# Patient Record
Sex: Male | Born: 1980
Health system: Southern US, Community
[De-identification: ages and names within clinical notes are randomized; demographics above are authoritative.]

## PROBLEM LIST (undated history)

## (undated) DIAGNOSIS — I2510 Atherosclerotic heart disease of native coronary artery without angina pectoris: Secondary | ICD-10-CM

## (undated) DIAGNOSIS — E785 Hyperlipidemia, unspecified: Secondary | ICD-10-CM

## (undated) HISTORY — DX: Hyperlipidemia, unspecified: E78.5

## (undated) HISTORY — DX: Atherosclerotic heart disease of native coronary artery without angina pectoris: I25.10

## (undated) HISTORY — PX: WISDOM TOOTH EXTRACTION: SHX21

## (undated) HISTORY — PX: OTHER SURGICAL HISTORY: SHX169

---

## 2015-06-17 DIAGNOSIS — H5213 Myopia, bilateral: Secondary | ICD-10-CM | POA: Diagnosis not present

## 2015-06-17 DIAGNOSIS — H52221 Regular astigmatism, right eye: Secondary | ICD-10-CM | POA: Diagnosis not present

## 2015-08-10 DIAGNOSIS — D225 Melanocytic nevi of trunk: Secondary | ICD-10-CM | POA: Diagnosis not present

## 2015-08-10 DIAGNOSIS — D2261 Melanocytic nevi of right upper limb, including shoulder: Secondary | ICD-10-CM | POA: Diagnosis not present

## 2015-08-10 DIAGNOSIS — D485 Neoplasm of uncertain behavior of skin: Secondary | ICD-10-CM | POA: Diagnosis not present

## 2015-08-10 DIAGNOSIS — D1801 Hemangioma of skin and subcutaneous tissue: Secondary | ICD-10-CM | POA: Diagnosis not present

## 2015-08-10 DIAGNOSIS — D2262 Melanocytic nevi of left upper limb, including shoulder: Secondary | ICD-10-CM | POA: Diagnosis not present

## 2016-04-10 ENCOUNTER — Institutional Professional Consult (permissible substitution): Payer: Self-pay | Admitting: Pulmonary Disease

## 2016-04-11 ENCOUNTER — Institutional Professional Consult (permissible substitution): Payer: Self-pay | Admitting: Pulmonary Disease

## 2016-04-19 DIAGNOSIS — G478 Other sleep disorders: Secondary | ICD-10-CM | POA: Diagnosis not present

## 2016-04-19 DIAGNOSIS — J3489 Other specified disorders of nose and nasal sinuses: Secondary | ICD-10-CM | POA: Diagnosis not present

## 2016-04-19 DIAGNOSIS — R0683 Snoring: Secondary | ICD-10-CM | POA: Diagnosis not present

## 2016-04-19 DIAGNOSIS — R0681 Apnea, not elsewhere classified: Secondary | ICD-10-CM | POA: Diagnosis not present

## 2016-04-21 DIAGNOSIS — G4733 Obstructive sleep apnea (adult) (pediatric): Secondary | ICD-10-CM | POA: Diagnosis not present

## 2016-04-23 DIAGNOSIS — G4733 Obstructive sleep apnea (adult) (pediatric): Secondary | ICD-10-CM | POA: Diagnosis not present

## 2016-05-02 DIAGNOSIS — G4733 Obstructive sleep apnea (adult) (pediatric): Secondary | ICD-10-CM | POA: Diagnosis not present

## 2016-05-22 DIAGNOSIS — G4733 Obstructive sleep apnea (adult) (pediatric): Secondary | ICD-10-CM | POA: Diagnosis not present

## 2016-06-02 DIAGNOSIS — G4733 Obstructive sleep apnea (adult) (pediatric): Secondary | ICD-10-CM | POA: Diagnosis not present

## 2016-06-19 DIAGNOSIS — H5213 Myopia, bilateral: Secondary | ICD-10-CM | POA: Diagnosis not present

## 2016-06-19 DIAGNOSIS — H52221 Regular astigmatism, right eye: Secondary | ICD-10-CM | POA: Diagnosis not present

## 2016-07-02 DIAGNOSIS — G4733 Obstructive sleep apnea (adult) (pediatric): Secondary | ICD-10-CM | POA: Diagnosis not present

## 2016-08-01 DIAGNOSIS — J310 Chronic rhinitis: Secondary | ICD-10-CM | POA: Diagnosis not present

## 2016-08-01 DIAGNOSIS — J343 Hypertrophy of nasal turbinates: Secondary | ICD-10-CM | POA: Diagnosis not present

## 2016-08-01 DIAGNOSIS — J342 Deviated nasal septum: Secondary | ICD-10-CM | POA: Diagnosis not present

## 2016-08-02 DIAGNOSIS — G4733 Obstructive sleep apnea (adult) (pediatric): Secondary | ICD-10-CM | POA: Diagnosis not present

## 2016-08-06 DIAGNOSIS — G4733 Obstructive sleep apnea (adult) (pediatric): Secondary | ICD-10-CM | POA: Diagnosis not present

## 2016-08-22 DIAGNOSIS — G4733 Obstructive sleep apnea (adult) (pediatric): Secondary | ICD-10-CM | POA: Diagnosis not present

## 2016-09-01 DIAGNOSIS — G4733 Obstructive sleep apnea (adult) (pediatric): Secondary | ICD-10-CM | POA: Diagnosis not present

## 2016-10-02 DIAGNOSIS — G4733 Obstructive sleep apnea (adult) (pediatric): Secondary | ICD-10-CM | POA: Diagnosis not present

## 2016-10-03 DIAGNOSIS — M545 Low back pain: Secondary | ICD-10-CM | POA: Diagnosis not present

## 2016-10-03 DIAGNOSIS — G4733 Obstructive sleep apnea (adult) (pediatric): Secondary | ICD-10-CM | POA: Diagnosis not present

## 2016-10-03 DIAGNOSIS — J342 Deviated nasal septum: Secondary | ICD-10-CM | POA: Diagnosis not present

## 2016-10-03 DIAGNOSIS — J328 Other chronic sinusitis: Secondary | ICD-10-CM | POA: Diagnosis not present

## 2016-10-03 DIAGNOSIS — Z1389 Encounter for screening for other disorder: Secondary | ICD-10-CM | POA: Diagnosis not present

## 2016-11-07 DIAGNOSIS — G4733 Obstructive sleep apnea (adult) (pediatric): Secondary | ICD-10-CM | POA: Diagnosis not present

## 2016-11-29 DIAGNOSIS — G4733 Obstructive sleep apnea (adult) (pediatric): Secondary | ICD-10-CM | POA: Diagnosis not present

## 2016-12-01 DIAGNOSIS — Z23 Encounter for immunization: Secondary | ICD-10-CM | POA: Diagnosis not present

## 2017-02-27 DIAGNOSIS — Z0000 Encounter for general adult medical examination without abnormal findings: Secondary | ICD-10-CM | POA: Diagnosis not present

## 2017-03-06 DIAGNOSIS — Z0000 Encounter for general adult medical examination without abnormal findings: Secondary | ICD-10-CM | POA: Diagnosis not present

## 2017-03-06 DIAGNOSIS — Z1389 Encounter for screening for other disorder: Secondary | ICD-10-CM | POA: Diagnosis not present

## 2017-03-06 DIAGNOSIS — J328 Other chronic sinusitis: Secondary | ICD-10-CM | POA: Diagnosis not present

## 2017-03-06 DIAGNOSIS — G4733 Obstructive sleep apnea (adult) (pediatric): Secondary | ICD-10-CM | POA: Diagnosis not present

## 2017-03-26 DIAGNOSIS — G4733 Obstructive sleep apnea (adult) (pediatric): Secondary | ICD-10-CM | POA: Diagnosis not present

## 2017-05-23 DIAGNOSIS — F4322 Adjustment disorder with anxiety: Secondary | ICD-10-CM | POA: Diagnosis not present

## 2017-05-29 DIAGNOSIS — F4322 Adjustment disorder with anxiety: Secondary | ICD-10-CM | POA: Diagnosis not present

## 2017-05-31 DIAGNOSIS — G4733 Obstructive sleep apnea (adult) (pediatric): Secondary | ICD-10-CM | POA: Diagnosis not present

## 2017-06-05 DIAGNOSIS — F4322 Adjustment disorder with anxiety: Secondary | ICD-10-CM | POA: Diagnosis not present

## 2017-06-14 DIAGNOSIS — F4322 Adjustment disorder with anxiety: Secondary | ICD-10-CM | POA: Diagnosis not present

## 2017-06-26 DIAGNOSIS — F4322 Adjustment disorder with anxiety: Secondary | ICD-10-CM | POA: Diagnosis not present

## 2017-07-23 DIAGNOSIS — H5213 Myopia, bilateral: Secondary | ICD-10-CM | POA: Diagnosis not present

## 2017-07-23 DIAGNOSIS — H52223 Regular astigmatism, bilateral: Secondary | ICD-10-CM | POA: Diagnosis not present

## 2017-07-24 DIAGNOSIS — F4322 Adjustment disorder with anxiety: Secondary | ICD-10-CM | POA: Diagnosis not present

## 2017-08-05 DIAGNOSIS — G4733 Obstructive sleep apnea (adult) (pediatric): Secondary | ICD-10-CM | POA: Diagnosis not present

## 2017-08-22 DIAGNOSIS — F4322 Adjustment disorder with anxiety: Secondary | ICD-10-CM | POA: Diagnosis not present

## 2017-09-17 DIAGNOSIS — G4733 Obstructive sleep apnea (adult) (pediatric): Secondary | ICD-10-CM | POA: Diagnosis not present

## 2017-10-22 DIAGNOSIS — F4322 Adjustment disorder with anxiety: Secondary | ICD-10-CM | POA: Diagnosis not present

## 2017-11-02 DIAGNOSIS — Z23 Encounter for immunization: Secondary | ICD-10-CM | POA: Diagnosis not present

## 2017-11-05 DIAGNOSIS — F4322 Adjustment disorder with anxiety: Secondary | ICD-10-CM | POA: Diagnosis not present

## 2017-11-26 DIAGNOSIS — F4322 Adjustment disorder with anxiety: Secondary | ICD-10-CM | POA: Diagnosis not present

## 2017-12-25 DIAGNOSIS — G4733 Obstructive sleep apnea (adult) (pediatric): Secondary | ICD-10-CM | POA: Diagnosis not present

## 2018-03-04 DIAGNOSIS — M545 Low back pain: Secondary | ICD-10-CM | POA: Diagnosis not present

## 2018-03-04 DIAGNOSIS — Z0000 Encounter for general adult medical examination without abnormal findings: Secondary | ICD-10-CM | POA: Diagnosis not present

## 2018-03-11 DIAGNOSIS — Z6825 Body mass index (BMI) 25.0-25.9, adult: Secondary | ICD-10-CM | POA: Diagnosis not present

## 2018-03-11 DIAGNOSIS — Z0000 Encounter for general adult medical examination without abnormal findings: Secondary | ICD-10-CM | POA: Diagnosis not present

## 2018-03-11 DIAGNOSIS — Z1331 Encounter for screening for depression: Secondary | ICD-10-CM | POA: Diagnosis not present

## 2018-04-11 DIAGNOSIS — G4733 Obstructive sleep apnea (adult) (pediatric): Secondary | ICD-10-CM | POA: Diagnosis not present

## 2018-07-03 DIAGNOSIS — D2261 Melanocytic nevi of right upper limb, including shoulder: Secondary | ICD-10-CM | POA: Diagnosis not present

## 2018-07-03 DIAGNOSIS — D2272 Melanocytic nevi of left lower limb, including hip: Secondary | ICD-10-CM | POA: Diagnosis not present

## 2018-07-03 DIAGNOSIS — D485 Neoplasm of uncertain behavior of skin: Secondary | ICD-10-CM | POA: Diagnosis not present

## 2018-07-03 DIAGNOSIS — D2262 Melanocytic nevi of left upper limb, including shoulder: Secondary | ICD-10-CM | POA: Diagnosis not present

## 2018-07-03 DIAGNOSIS — D225 Melanocytic nevi of trunk: Secondary | ICD-10-CM | POA: Diagnosis not present

## 2018-07-10 DIAGNOSIS — G4733 Obstructive sleep apnea (adult) (pediatric): Secondary | ICD-10-CM | POA: Diagnosis not present

## 2018-07-22 DIAGNOSIS — D2261 Melanocytic nevi of right upper limb, including shoulder: Secondary | ICD-10-CM | POA: Diagnosis not present

## 2018-07-22 DIAGNOSIS — D485 Neoplasm of uncertain behavior of skin: Secondary | ICD-10-CM | POA: Diagnosis not present

## 2018-07-24 DIAGNOSIS — L7211 Pilar cyst: Secondary | ICD-10-CM | POA: Diagnosis not present

## 2018-08-05 DIAGNOSIS — H5213 Myopia, bilateral: Secondary | ICD-10-CM | POA: Diagnosis not present

## 2018-08-05 DIAGNOSIS — H52223 Regular astigmatism, bilateral: Secondary | ICD-10-CM | POA: Diagnosis not present

## 2018-08-11 DIAGNOSIS — G4733 Obstructive sleep apnea (adult) (pediatric): Secondary | ICD-10-CM | POA: Diagnosis not present

## 2018-10-08 DIAGNOSIS — G4733 Obstructive sleep apnea (adult) (pediatric): Secondary | ICD-10-CM | POA: Diagnosis not present

## 2018-10-18 DIAGNOSIS — Z23 Encounter for immunization: Secondary | ICD-10-CM | POA: Diagnosis not present

## 2018-11-11 DIAGNOSIS — G4733 Obstructive sleep apnea (adult) (pediatric): Secondary | ICD-10-CM | POA: Diagnosis not present

## 2018-12-15 ENCOUNTER — Other Ambulatory Visit: Payer: Self-pay | Admitting: *Deleted

## 2018-12-15 DIAGNOSIS — Z20822 Contact with and (suspected) exposure to covid-19: Secondary | ICD-10-CM

## 2018-12-18 LAB — NOVEL CORONAVIRUS, NAA: SARS-CoV-2, NAA: NOT DETECTED

## 2019-02-09 DIAGNOSIS — G4733 Obstructive sleep apnea (adult) (pediatric): Secondary | ICD-10-CM | POA: Diagnosis not present

## 2019-03-20 DIAGNOSIS — R7989 Other specified abnormal findings of blood chemistry: Secondary | ICD-10-CM | POA: Diagnosis not present

## 2019-03-20 DIAGNOSIS — Z0000 Encounter for general adult medical examination without abnormal findings: Secondary | ICD-10-CM | POA: Diagnosis not present

## 2019-03-20 DIAGNOSIS — Z125 Encounter for screening for malignant neoplasm of prostate: Secondary | ICD-10-CM | POA: Diagnosis not present

## 2019-03-20 DIAGNOSIS — R82998 Other abnormal findings in urine: Secondary | ICD-10-CM | POA: Diagnosis not present

## 2019-03-20 DIAGNOSIS — Z1331 Encounter for screening for depression: Secondary | ICD-10-CM | POA: Diagnosis not present

## 2019-05-11 DIAGNOSIS — G4733 Obstructive sleep apnea (adult) (pediatric): Secondary | ICD-10-CM | POA: Diagnosis not present

## 2019-06-30 DIAGNOSIS — D225 Melanocytic nevi of trunk: Secondary | ICD-10-CM | POA: Diagnosis not present

## 2019-06-30 DIAGNOSIS — D224 Melanocytic nevi of scalp and neck: Secondary | ICD-10-CM | POA: Diagnosis not present

## 2019-06-30 DIAGNOSIS — D485 Neoplasm of uncertain behavior of skin: Secondary | ICD-10-CM | POA: Diagnosis not present

## 2019-08-11 DIAGNOSIS — G4733 Obstructive sleep apnea (adult) (pediatric): Secondary | ICD-10-CM | POA: Diagnosis not present

## 2019-08-17 DIAGNOSIS — G4733 Obstructive sleep apnea (adult) (pediatric): Secondary | ICD-10-CM | POA: Diagnosis not present

## 2019-09-01 DIAGNOSIS — H5213 Myopia, bilateral: Secondary | ICD-10-CM | POA: Diagnosis not present

## 2019-09-01 DIAGNOSIS — H52223 Regular astigmatism, bilateral: Secondary | ICD-10-CM | POA: Diagnosis not present

## 2019-11-07 DIAGNOSIS — Z23 Encounter for immunization: Secondary | ICD-10-CM | POA: Diagnosis not present

## 2019-11-09 DIAGNOSIS — G4733 Obstructive sleep apnea (adult) (pediatric): Secondary | ICD-10-CM | POA: Diagnosis not present

## 2020-02-08 DIAGNOSIS — G4733 Obstructive sleep apnea (adult) (pediatric): Secondary | ICD-10-CM | POA: Diagnosis not present

## 2020-03-25 DIAGNOSIS — Z125 Encounter for screening for malignant neoplasm of prostate: Secondary | ICD-10-CM | POA: Diagnosis not present

## 2020-03-25 DIAGNOSIS — Z0000 Encounter for general adult medical examination without abnormal findings: Secondary | ICD-10-CM | POA: Diagnosis not present

## 2020-03-25 DIAGNOSIS — E785 Hyperlipidemia, unspecified: Secondary | ICD-10-CM | POA: Diagnosis not present

## 2020-04-01 DIAGNOSIS — R82998 Other abnormal findings in urine: Secondary | ICD-10-CM | POA: Diagnosis not present

## 2020-04-01 DIAGNOSIS — Z0000 Encounter for general adult medical examination without abnormal findings: Secondary | ICD-10-CM | POA: Diagnosis not present

## 2020-05-09 DIAGNOSIS — G4733 Obstructive sleep apnea (adult) (pediatric): Secondary | ICD-10-CM | POA: Diagnosis not present

## 2020-06-29 DIAGNOSIS — D225 Melanocytic nevi of trunk: Secondary | ICD-10-CM | POA: Diagnosis not present

## 2020-06-29 DIAGNOSIS — D2261 Melanocytic nevi of right upper limb, including shoulder: Secondary | ICD-10-CM | POA: Diagnosis not present

## 2020-06-29 DIAGNOSIS — D2262 Melanocytic nevi of left upper limb, including shoulder: Secondary | ICD-10-CM | POA: Diagnosis not present

## 2020-06-29 DIAGNOSIS — D485 Neoplasm of uncertain behavior of skin: Secondary | ICD-10-CM | POA: Diagnosis not present

## 2020-06-29 DIAGNOSIS — D224 Melanocytic nevi of scalp and neck: Secondary | ICD-10-CM | POA: Diagnosis not present

## 2020-07-27 DIAGNOSIS — D485 Neoplasm of uncertain behavior of skin: Secondary | ICD-10-CM | POA: Diagnosis not present

## 2020-07-27 DIAGNOSIS — L905 Scar conditions and fibrosis of skin: Secondary | ICD-10-CM | POA: Diagnosis not present

## 2020-08-09 DIAGNOSIS — G4733 Obstructive sleep apnea (adult) (pediatric): Secondary | ICD-10-CM | POA: Diagnosis not present

## 2020-08-12 DIAGNOSIS — H3562 Retinal hemorrhage, left eye: Secondary | ICD-10-CM | POA: Diagnosis not present

## 2020-09-14 ENCOUNTER — Other Ambulatory Visit: Payer: Self-pay | Admitting: Internal Medicine

## 2020-09-14 ENCOUNTER — Other Ambulatory Visit (HOSPITAL_COMMUNITY): Payer: Self-pay | Admitting: Internal Medicine

## 2020-09-14 DIAGNOSIS — G44319 Acute post-traumatic headache, not intractable: Secondary | ICD-10-CM

## 2020-09-14 DIAGNOSIS — R202 Paresthesia of skin: Secondary | ICD-10-CM

## 2020-09-14 DIAGNOSIS — R519 Headache, unspecified: Secondary | ICD-10-CM | POA: Diagnosis not present

## 2020-09-15 ENCOUNTER — Ambulatory Visit (HOSPITAL_COMMUNITY)
Admission: RE | Admit: 2020-09-15 | Discharge: 2020-09-15 | Disposition: A | Discharge status: Home / Self Care | Payer: BC Managed Care – PPO | Source: Ambulatory Visit | Attending: Internal Medicine | Admitting: Internal Medicine

## 2020-09-15 DIAGNOSIS — G44319 Acute post-traumatic headache, not intractable: Secondary | ICD-10-CM | POA: Diagnosis not present

## 2020-09-15 DIAGNOSIS — R519 Headache, unspecified: Secondary | ICD-10-CM | POA: Diagnosis not present

## 2020-09-15 DIAGNOSIS — R202 Paresthesia of skin: Secondary | ICD-10-CM

## 2020-09-15 MED ORDER — GADOBUTROL 1 MMOL/ML IV SOLN
10.0000 mL | Freq: Once | INTRAVENOUS | Status: AC | PRN
  Administered 2020-09-15: 10 mL via INTRAVENOUS

## 2020-09-20 DIAGNOSIS — H3562 Retinal hemorrhage, left eye: Secondary | ICD-10-CM | POA: Diagnosis not present

## 2020-11-10 DIAGNOSIS — G4733 Obstructive sleep apnea (adult) (pediatric): Secondary | ICD-10-CM | POA: Diagnosis not present

## 2020-12-11 DIAGNOSIS — G4733 Obstructive sleep apnea (adult) (pediatric): Secondary | ICD-10-CM | POA: Diagnosis not present

## 2021-01-10 DIAGNOSIS — G4733 Obstructive sleep apnea (adult) (pediatric): Secondary | ICD-10-CM | POA: Diagnosis not present

## 2021-02-08 DIAGNOSIS — G4733 Obstructive sleep apnea (adult) (pediatric): Secondary | ICD-10-CM | POA: Diagnosis not present

## 2021-03-11 DIAGNOSIS — G4733 Obstructive sleep apnea (adult) (pediatric): Secondary | ICD-10-CM | POA: Diagnosis not present

## 2021-04-08 DIAGNOSIS — G4733 Obstructive sleep apnea (adult) (pediatric): Secondary | ICD-10-CM | POA: Diagnosis not present

## 2021-04-25 DIAGNOSIS — E785 Hyperlipidemia, unspecified: Secondary | ICD-10-CM | POA: Diagnosis not present

## 2021-04-25 DIAGNOSIS — Z125 Encounter for screening for malignant neoplasm of prostate: Secondary | ICD-10-CM | POA: Diagnosis not present

## 2021-05-03 DIAGNOSIS — Z0000 Encounter for general adult medical examination without abnormal findings: Secondary | ICD-10-CM | POA: Diagnosis not present

## 2021-05-03 DIAGNOSIS — Z1331 Encounter for screening for depression: Secondary | ICD-10-CM | POA: Diagnosis not present

## 2021-05-03 DIAGNOSIS — Z1339 Encounter for screening examination for other mental health and behavioral disorders: Secondary | ICD-10-CM | POA: Diagnosis not present

## 2021-05-04 ENCOUNTER — Other Ambulatory Visit: Payer: Self-pay | Admitting: Internal Medicine

## 2021-05-04 DIAGNOSIS — E785 Hyperlipidemia, unspecified: Secondary | ICD-10-CM

## 2021-05-09 DIAGNOSIS — G4733 Obstructive sleep apnea (adult) (pediatric): Secondary | ICD-10-CM | POA: Diagnosis not present

## 2021-06-07 ENCOUNTER — Ambulatory Visit
Admission: RE | Admit: 2021-06-07 | Discharge: 2021-06-07 | Disposition: A | Discharge status: Home / Self Care | Payer: No Typology Code available for payment source | Source: Ambulatory Visit | Attending: Internal Medicine | Admitting: Internal Medicine

## 2021-06-07 DIAGNOSIS — E785 Hyperlipidemia, unspecified: Secondary | ICD-10-CM

## 2021-06-07 DIAGNOSIS — Z8249 Family history of ischemic heart disease and other diseases of the circulatory system: Secondary | ICD-10-CM | POA: Diagnosis not present

## 2021-06-08 DIAGNOSIS — G4733 Obstructive sleep apnea (adult) (pediatric): Secondary | ICD-10-CM | POA: Diagnosis not present

## 2021-06-09 DIAGNOSIS — H43393 Other vitreous opacities, bilateral: Secondary | ICD-10-CM | POA: Diagnosis not present

## 2021-06-30 ENCOUNTER — Ambulatory Visit: Payer: BC Managed Care – PPO | Admitting: Cardiology

## 2021-06-30 ENCOUNTER — Encounter: Payer: Self-pay | Admitting: Cardiology

## 2021-06-30 VITALS — BP 141/88 | HR 91 | Temp 97.6°F | Resp 17 | Ht 75.0 in | Wt 195.0 lb

## 2021-06-30 DIAGNOSIS — E7800 Pure hypercholesterolemia, unspecified: Secondary | ICD-10-CM

## 2021-06-30 DIAGNOSIS — I2584 Coronary atherosclerosis due to calcified coronary lesion: Secondary | ICD-10-CM | POA: Diagnosis not present

## 2021-06-30 DIAGNOSIS — R931 Abnormal findings on diagnostic imaging of heart and coronary circulation: Secondary | ICD-10-CM | POA: Diagnosis not present

## 2021-06-30 DIAGNOSIS — Z8249 Family history of ischemic heart disease and other diseases of the circulatory system: Secondary | ICD-10-CM

## 2021-06-30 DIAGNOSIS — I2510 Atherosclerotic heart disease of native coronary artery without angina pectoris: Secondary | ICD-10-CM | POA: Diagnosis not present

## 2021-06-30 NOTE — Progress Notes (Signed)
ID:  Kristopher Lean., DOB Apr 19, 1980, MRN [id number]  PCP:  Velna Hatchet, MD  Cardiologist:  Rex Kras, DO, [facility] (established care 06/30/2021)  REASON FOR CONSULT: Coronary artery calcification  REQUESTING PHYSICIAN:  Velna Hatchet, MD [address] [place],  [place] [id number]  Chief Complaint  Patient presents with   New Patient (Initial Visit)   Coronary Artery Disease    HPI  Kristopher Rison. is a 41 y.o. Caucasian male who presents to the clinic for evaluation of coronary artery calcification at the request of Velna Hatchet, MD. His past medical history and cardiovascular risk factors include: Severe coronary artery calcification, sleep apnea on CPAP, family history of heart disease.  Patient recently had a yearly physical with his PCP and given his lipid profile and family history of heart disease that shared decision was to proceed with coronary calcium score for further risk stratification to monitor/evaluate for CAD as he was not on statin therapy.  No family history of premature coronary artery disease.  However paternal grandfather and uncles have had CAD at a relatively young age.  No structured exercise program or daily routine.  But is active on a day-to-day basis and takes care of 2 kids and yard work.  Since the results of his lipids and coronary calcium score he has been more cognizant of his dietary intake of foods which are high in cholesterol.  FUNCTIONAL STATUS: No structured exercise program or daily routine.   ALLERGIES: Allergies  Allergen Reactions   Penicillins     Childhood allergy    MEDICATION LIST PRIOR TO VISIT: Current Meds  Medication Sig   aspirin EC 81 MG tablet 1 tablet Orally Once a day   cetirizine (ZYRTEC) 10 MG chewable tablet Chew 10 mg by mouth as needed for allergies.   fluticasone (FLONASE) 50 MCG/ACT nasal spray Place 1 spray into both nostrils as needed for allergies or rhinitis.   rosuvastatin (CRESTOR) 20  MG tablet Take 20 mg by mouth at bedtime.     PAST MEDICAL HISTORY: Past Medical History:  Diagnosis Date   Coronary atherosclerosis due to calcified coronary lesion    Hyperlipidemia     PAST SURGICAL HISTORY: History reviewed. No pertinent surgical history.  FAMILY HISTORY: The patient family history includes Heart attack in his paternal grandfather, paternal uncle, and paternal uncle; Stroke (age of onset: 49) in his paternal grandmother.  SOCIAL HISTORY:  The patient  reports that he has never smoked. He has never used smokeless tobacco. He reports current alcohol use. He reports that he does not use drugs.  REVIEW OF SYSTEMS: Review of Systems  Cardiovascular:  Negative for chest pain, dyspnea on exertion, leg swelling, near-syncope, orthopnea, palpitations, paroxysmal nocturnal dyspnea and syncope.  Respiratory:  Negative for shortness of breath.    PHYSICAL EXAM:    06/30/2021    8:39 AM  Vitals with BMI  Height 6' 3"  Weight 195 lbs  BMI 54.98  Systolic 264  Diastolic 88  Pulse 91    CONSTITUTIONAL: Well-developed and well-nourished. No acute distress.  SKIN: Skin is warm and dry. No rash noted. No cyanosis. No pallor. No jaundice HEAD: Normocephalic and atraumatic.  EYES: No scleral icterus MOUTH/THROAT: Moist oral membranes.  NECK: No JVD present. No thyromegaly noted. No carotid bruits  CHEST Normal respiratory effort. No intercostal retractions  LUNGS: Clear to auscultation bilaterally.  No stridor. No wheezes. No rales.  CARDIOVASCULAR: Regular rate and rhythm, positive S1-S2, no murmurs rubs  or gallops appreciated. ABDOMINAL: Soft, nontender, nondistended, positive bowel sounds in all 4 quadrants, no apparent ascites.  EXTREMITIES: No peripheral edema, warm to touch, 2+ bilateral DP and PT pulses HEMATOLOGIC: No significant bruising NEUROLOGIC: Oriented to person, place, and time. Nonfocal. Normal muscle tone.  PSYCHIATRIC: Normal mood and affect. Normal  behavior. Cooperative  CARDIAC DATABASE: EKG: 06/30/2021: Normal sinus rhythm, 87 bpm, normal axis, without underlying injury pattern.  Echocardiogram: No results found for this or any previous visit from the past 1095 days.    Stress Testing: No results found for this or any previous visit from the past 1095 days.  Calcium Scoring: 06/07/2021 Left Main: 24 LAD: 312 LCx: 14 RCA: 81 Total Agatston Score: 431   MESA database percentile: 99th (for a 44 year Caucasian old male) 1. Patient's total coronary artery calcium score is 431 which is ninety-ninth percentile for 45-year-old Caucasian male (the reference MESA database does not include individuals < 45 years of age). Please note that although the presence of coronary artery calcium documents the presence of coronary artery disease, the severity of this disease and any potential stenosis cannot be assessed on this noncontrast CT examination. Assessment for potential risk factor modification, dietary therapy or pharmacologic therapy may be warranted, if clinically indicated. 2. Hepatic steatosis.  Heart Catheterization: None  LABORATORY DATA: External Labs: Collected: April 25, 2021 provided by referring physician. Sodium 138, potassium 4.4, chloride 100, bicarb 30 BUN 12, creatinine 0.8. eGFR >60 mL/min/m AST 18, ALT 29, alkaline phosphatase 67. Hemoglobin 15.5 g/dL, hematocrit 45.8% Total cholesterol 209, triglycerides 106, HDL 32, LDL 156, non-HDL 177. TSH 0.84   IMPRESSION:    ICD-10-CM   1. Coronary atherosclerosis due to calcified coronary lesion  I25.10 EKG 12-Lead   I25.84 PCV ECHOCARDIOGRAM COMPLETE    PCV MYOCARDIAL PERFUSION WO LEXISCAN    2. Agatston CAC score, >400  R93.1     3. Pure hypercholesterolemia  E78.00 Lipid Panel With LDL/HDL Ratio    LDL cholesterol, direct    CMP14+EGFR    4. Family history of heart disease  Z82.49        RECOMMENDATIONS: Kristopher M Trawick Jr. is a 41 y.o.  Caucasian male whose past medical history and cardiac risk factors include: Severe coronary artery calcification, sleep apnea on CPAP, family history of heart disease.  Coronary atherosclerosis due to calcified coronary lesion / Agatston CAC score, >400 Total CAC 431 AU, his current age is 41; however, if he was 44 he would be at 99th percentile for age/ethnicity matched cohorts. Denies angina pectoris or heart failure symptoms. EKG: Nonischemic Already started on aspirin and Crestor 20 mg p.o. nightly by his PCP. Repeat blood work after 4 weeks of starting statin therapy to evaluate lipid profile and LFTs. Echo will be ordered to evaluate for structural heart disease and left ventricular systolic function. Exercise nuclear stress test to evaluate for reversible ischemia. Discussed the importance of reducing foods high in cholesterol content. Educated on the importance of secondary prevention. Encouraged increasing physical activity as tolerated with a goal of 30 minutes a day 5 days a week.  Pure hypercholesterolemia Currently on rosuvastatin 20 mg p.o. nightly. Indexed LDL 156 mg/dL as of March 2023 We will order repeat labs after being on statin therapy for 6 weeks.  As part of today's office visit reviewed outside records provided by PCP including office notes and labs which were independently reviewed and noted above for further reference.  EKG ordered and independently reviewed.  An   additional diagnostic work-up ordered as noted above.  Further recommendations to follow.  FINAL MEDICATION LIST END OF ENCOUNTER: No orders of the defined types were placed in this encounter.   There are no discontinued medications.   Current Outpatient Medications:    aspirin EC 81 MG tablet, 1 tablet Orally Once a day, Disp: , Rfl:    cetirizine (ZYRTEC) 10 MG chewable tablet, Chew 10 mg by mouth as needed for allergies., Disp: , Rfl:    fluticasone (FLONASE) 50 MCG/ACT nasal spray, Place 1 spray  into both nostrils as needed for allergies or rhinitis., Disp: , Rfl:    rosuvastatin (CRESTOR) 20 MG tablet, Take 20 mg by mouth at bedtime., Disp: , Rfl:   Orders Placed This Encounter  Procedures   Lipid Panel With LDL/HDL Ratio   LDL cholesterol, direct   CMP14+EGFR   PCV MYOCARDIAL PERFUSION WO LEXISCAN   EKG 12-Lead   PCV ECHOCARDIOGRAM COMPLETE    There are no Patient Instructions on file for this visit.   --Continue cardiac medications as reconciled in final medication list. --Return in about 6 weeks (around 08/11/2021) for Follow up, Coronary artery calcification, Review test results. or sooner if needed. --Continue follow-up with your primary care physician regarding the management of your other chronic comorbid conditions.  Patient's questions and concerns were addressed to his satisfaction. He voices understanding of the instructions provided during this encounter.   This note was created using a voice recognition software as a result there may be grammatical errors inadvertently enclosed that do not reflect the nature of this encounter. Every attempt is made to correct such errors.  Sunit Tolia, DO, FACC  Pager: [phone] Office: [phone]   

## 2021-07-09 DIAGNOSIS — G4733 Obstructive sleep apnea (adult) (pediatric): Secondary | ICD-10-CM | POA: Diagnosis not present

## 2021-07-10 DIAGNOSIS — D2261 Melanocytic nevi of right upper limb, including shoulder: Secondary | ICD-10-CM | POA: Diagnosis not present

## 2021-07-10 DIAGNOSIS — D2272 Melanocytic nevi of left lower limb, including hip: Secondary | ICD-10-CM | POA: Diagnosis not present

## 2021-07-10 DIAGNOSIS — D2262 Melanocytic nevi of left upper limb, including shoulder: Secondary | ICD-10-CM | POA: Diagnosis not present

## 2021-07-10 DIAGNOSIS — C4441 Basal cell carcinoma of skin of scalp and neck: Secondary | ICD-10-CM | POA: Diagnosis not present

## 2021-07-10 DIAGNOSIS — D225 Melanocytic nevi of trunk: Secondary | ICD-10-CM | POA: Diagnosis not present

## 2021-07-10 DIAGNOSIS — D485 Neoplasm of uncertain behavior of skin: Secondary | ICD-10-CM | POA: Diagnosis not present

## 2021-07-12 ENCOUNTER — Ambulatory Visit: Payer: BC Managed Care – PPO

## 2021-07-12 DIAGNOSIS — I2510 Atherosclerotic heart disease of native coronary artery without angina pectoris: Secondary | ICD-10-CM

## 2021-07-24 ENCOUNTER — Ambulatory Visit: Payer: BC Managed Care – PPO

## 2021-07-24 DIAGNOSIS — I2584 Coronary atherosclerosis due to calcified coronary lesion: Secondary | ICD-10-CM | POA: Diagnosis not present

## 2021-07-24 DIAGNOSIS — I2510 Atherosclerotic heart disease of native coronary artery without angina pectoris: Secondary | ICD-10-CM

## 2021-08-02 ENCOUNTER — Other Ambulatory Visit: Payer: Self-pay | Admitting: Cardiology

## 2021-08-02 DIAGNOSIS — E7800 Pure hypercholesterolemia, unspecified: Secondary | ICD-10-CM | POA: Diagnosis not present

## 2021-08-03 LAB — CMP14+EGFR
ALT: 26 IU/L (ref 0–44)
AST: 20 IU/L (ref 0–40)
Albumin/Globulin Ratio: 2.3 — ABNORMAL HIGH (ref 1.2–2.2)
Albumin: 4.8 g/dL (ref 4.0–5.0)
Alkaline Phosphatase: 71 IU/L (ref 44–121)
BUN/Creatinine Ratio: 13 (ref 9–20)
BUN: 12 mg/dL (ref 6–24)
Bilirubin Total: 0.5 mg/dL (ref 0.0–1.2)
CO2: 23 mmol/L (ref 20–29)
Calcium: 9.5 mg/dL (ref 8.7–10.2)
Chloride: 102 mmol/L (ref 96–106)
Creatinine, Ser: 0.91 mg/dL (ref 0.76–1.27)
Globulin, Total: 2.1 g/dL (ref 1.5–4.5)
Glucose: 92 mg/dL (ref 70–99)
Potassium: 4.5 mmol/L (ref 3.5–5.2)
Sodium: 141 mmol/L (ref 134–144)
Total Protein: 6.9 g/dL (ref 6.0–8.5)
eGFR: 108 mL/min/{1.73_m2} (ref >59)

## 2021-08-03 LAB — LIPID PANEL WITH LDL/HDL RATIO
Cholesterol, Total: 104 mg/dL (ref 100–199)
HDL: 35 mg/dL — ABNORMAL LOW (ref >39)
LDL Chol Calc (NIH): 50 mg/dL (ref 0–99)
LDL/HDL Ratio: 1.4 ratio (ref 0.0–3.6)
Triglycerides: 99 mg/dL (ref 0–149)
VLDL Cholesterol Cal: 19 mg/dL (ref 5–40)

## 2021-08-03 LAB — LDL CHOLESTEROL, DIRECT: LDL Direct: 55 mg/dL (ref 0–99)

## 2021-08-07 DIAGNOSIS — G4733 Obstructive sleep apnea (adult) (pediatric): Secondary | ICD-10-CM | POA: Diagnosis not present

## 2021-08-11 ENCOUNTER — Encounter: Payer: Self-pay | Admitting: Cardiology

## 2021-08-11 ENCOUNTER — Ambulatory Visit: Payer: BC Managed Care – PPO | Admitting: Cardiology

## 2021-08-11 VITALS — BP 152/94 | HR 77 | Temp 98.0°F | Resp 16 | Ht 75.0 in | Wt 192.0 lb

## 2021-08-11 DIAGNOSIS — I2510 Atherosclerotic heart disease of native coronary artery without angina pectoris: Secondary | ICD-10-CM

## 2021-08-11 DIAGNOSIS — E7800 Pure hypercholesterolemia, unspecified: Secondary | ICD-10-CM | POA: Diagnosis not present

## 2021-08-11 DIAGNOSIS — R931 Abnormal findings on diagnostic imaging of heart and coronary circulation: Secondary | ICD-10-CM

## 2021-08-11 DIAGNOSIS — Z8249 Family history of ischemic heart disease and other diseases of the circulatory system: Secondary | ICD-10-CM | POA: Diagnosis not present

## 2021-08-11 MED ORDER — ROSUVASTATIN CALCIUM 10 MG PO TABS: 10.0000 mg | ORAL_TABLET | Freq: Every evening | ORAL | 0 refills | Status: DC

## 2021-08-11 NOTE — Progress Notes (Signed)
ID:  Kristopher Lean., DOB December 01, 1980, MRN [id number]  PCP:  Velna Hatchet, MD  Cardiologist:  Rex Kras, DO, [facility] (established care 06/30/2021)  Date: 08/11/21 Last Office Visit: 06/30/2021  Chief Complaint  Patient presents with   Coronary artery calcification   Results   Follow-up    HPI  Kristopher Harvie. is a 41 y.o. Caucasian male whose past medical history and cardiovascular risk factors include: Severe coronary artery calcification (at age of 41 total CAC 50, placing him at the 99th percentile (if he was 42years of age)), sleep apnea on CPAP, family history of heart disease.  Patient was referred to the practice given his severe coronary artery calcification.  His total CAC was 431 and if he was 41 years of age we would place him at the 99th percentile for age-matched cohort.  He was started on aspirin and statin therapy by PCP prior to being referred to cardiology.  At the last office visit we discussed undergoing echo and exercise nuclear stress test.  Patient exercised for 7 minutes and 10 seconds achieved 8.86 METS.  The reason of exercise termination was documented to be tired and fatigue; however, patient states that he could have exercised longer if needed.  He had repeat labs after being on Crestor 20 mg p.o. nightly and his LDL has improved significantly.  He is tolerating the medication well without any side effects or intolerances.  He denies angina pectoris or heart failure symptoms.  FUNCTIONAL STATUS: No structured exercise program or daily routine.   ALLERGIES: Allergies  Allergen Reactions   Penicillins     Childhood allergy    MEDICATION LIST PRIOR TO VISIT: Current Meds  Medication Sig   aspirin EC 81 MG tablet 1 tablet Orally Once a day   cetirizine (ZYRTEC) 10 MG chewable tablet Chew 10 mg by mouth as needed for allergies.   fluticasone (FLONASE) 50 MCG/ACT nasal spray Place 1 spray into both nostrils as needed for allergies or  rhinitis.   rosuvastatin (CRESTOR) 10 MG tablet Take 1 tablet (10 mg total) by mouth at bedtime.   [DISCONTINUED] rosuvastatin (CRESTOR) 20 MG tablet Take 20 mg by mouth at bedtime.     PAST MEDICAL HISTORY: Past Medical History:  Diagnosis Date   Coronary atherosclerosis due to calcified coronary lesion    Hyperlipidemia     PAST SURGICAL HISTORY: History reviewed. No pertinent surgical history.  FAMILY HISTORY: The patient family history includes Heart attack in his paternal grandfather, paternal uncle, and paternal uncle; Stroke (age of onset: 31) in his paternal grandmother.  SOCIAL HISTORY:  The patient  reports that he has never smoked. He has never used smokeless tobacco. He reports current alcohol use. He reports that he does not use drugs.  REVIEW OF SYSTEMS: Review of Systems  Cardiovascular:  Negative for chest pain, dyspnea on exertion, leg swelling, near-syncope, orthopnea, palpitations, paroxysmal nocturnal dyspnea and syncope.  Respiratory:  Negative for shortness of breath.     PHYSICAL EXAM:    08/11/2021    1:57 PM 06/30/2021    8:39 AM  Vitals with BMI  Height $Remov'6\' 3"'SvCapD$  $Remove'6\' 3"'ZYHqzPD$   Weight 192 lbs 195 lbs  BMI 24 33.00  Systolic 762 263  Diastolic 94 88  Pulse 77 91    CONSTITUTIONAL: Well-developed and well-nourished. No acute distress.  SKIN: Skin is warm and dry. No rash noted. No cyanosis. No pallor. No jaundice HEAD: Normocephalic and atraumatic.  EYES: No scleral icterus MOUTH/THROAT:  Moist oral membranes.  NECK: No JVD present. No thyromegaly noted. No carotid bruits  CHEST Normal respiratory effort. No intercostal retractions  LUNGS: Clear to auscultation bilaterally.  No stridor. No wheezes. No rales.  CARDIOVASCULAR: Regular rate and rhythm, positive S1-S2, no murmurs rubs or gallops appreciated. ABDOMINAL: Soft, nontender, nondistended, positive bowel sounds in all 4 quadrants, no apparent ascites.  EXTREMITIES: No peripheral edema, warm to touch,  2+ bilateral DP and PT pulses HEMATOLOGIC: No significant bruising NEUROLOGIC: Oriented to person, place, and time. Nonfocal. Normal muscle tone.  PSYCHIATRIC: Normal mood and affect. Normal behavior. Cooperative  CARDIAC DATABASE: EKG: 06/30/2021: Normal sinus rhythm, 87 bpm, normal axis, without underlying injury pattern.  Echocardiogram: 07/12/2021:  Normal LV systolic function with visual EF 60-65%. Left ventricle cavity is normal in size. Normal left ventricular wall thickness. Normal global wall motion. Normal diastolic filling pattern, normal LAP.  Trace tricuspid regurgitation. No evidence of pulmonary hypertension.  No prior study for comparison.   Stress Testing: Exercise Sestamibi stress test 07/24/2021: Exercise nuclear stress test was performed using Bruce protocol.   1 Day Rest and Stress images. Exercise time 7 minutes 10 seconds, achieved 8.86 METS, 87% APMHR.  Stress ECG negative for ischemia.   Normal myocardial perfusion without convincing evidence of reversible myocardial ischemia or prior infarct. Calculated LVEF 62%, left ventricular size and wall thickness preserved, no obvious regional wall motion abnormalities. Low risk study.   Calcium Scoring: 06/07/2021 Left Main: 24 LAD: 312 LCx: 14 RCA: 81 Total Agatston Score: 431   MESA database percentile: 99th (for a 14 year Caucasian old male) 1. Patient's total coronary artery calcium score is 431 which is ninety-ninth percentile for 41 year old Caucasian male (the reference [facility] does not include individuals < 42 years of age). Please note that although the presence of coronary artery calcium documents the presence of coronary artery disease, the severity of this disease and any potential stenosis cannot be assessed on this noncontrast CT examination. Assessment for potential risk factor modification, dietary therapy or pharmacologic therapy may be warranted, if clinically indicated. 2. Hepatic  steatosis.  Heart Catheterization: None  LABORATORY DATA: External Labs: Collected: April 25, 2021 provided by referring physician. Sodium 138, potassium 4.4, chloride 100, bicarb 30 BUN 12, creatinine 0.8. eGFR >60 mL/min/m AST 18, ALT 29, alkaline phosphatase 67. Hemoglobin 15.5 g/dL, hematocrit 45.8% Total cholesterol 209, triglycerides 106, HDL 32, LDL 156, non-HDL 177. TSH 0.84  LABORATORY DATA:     Latest Ref Rng & Units 08/02/2021    9:15 AM  CMP  Glucose 70 - 99 mg/dL 92   BUN 6 - 24 mg/dL 12   Creatinine 0.76 - 1.27 mg/dL 0.91   Sodium 134 - 144 mmol/L 141   Potassium 3.5 - 5.2 mmol/L 4.5   Chloride 96 - 106 mmol/L 102   CO2 20 - 29 mmol/L 23   Calcium 8.7 - 10.2 mg/dL 9.5   Total Protein 6.0 - 8.5 g/dL 6.9   Total Bilirubin 0.0 - 1.2 mg/dL 0.5   Alkaline Phos 44 - 121 IU/L 71   AST 0 - 40 IU/L 20   ALT 0 - 44 IU/L 26     Lipid Panel     Component Value Date/Time   CHOL 104 08/02/2021 0915   TRIG 99 08/02/2021 0915   HDL 35 (L) 08/02/2021 0915   LDLCALC 50 08/02/2021 0915   LDLDIRECT 55 08/02/2021 0915   LABVLDL 19 08/02/2021 0915   IMPRESSION:    ICD-10-CM  1. Coronary atherosclerosis due to calcified coronary lesion  I25.10 rosuvastatin (CRESTOR) 10 MG tablet   I25.84 Lipid Panel With LDL/HDL Ratio    LDL cholesterol, direct    CMP14+EGFR    2. Agatston CAC score, >400  R93.1 rosuvastatin (CRESTOR) 10 MG tablet    Lipid Panel With LDL/HDL Ratio    LDL cholesterol, direct    CMP14+EGFR    3. Pure hypercholesterolemia  E78.00 rosuvastatin (CRESTOR) 10 MG tablet    Lipid Panel With LDL/HDL Ratio    LDL cholesterol, direct    CMP14+EGFR    4. Family history of heart disease  Z82.49        RECOMMENDATIONS: Kristopher Klaus. is a 41 y.o. Caucasian male whose past medical history and cardiac risk factors include: Severe coronary artery calcification, sleep apnea on CPAP, family history of heart disease.  Coronary atherosclerosis due to  calcified coronary lesion / Agatston CAC score, >400 Total CAC 431 AU, his current age is 49; however, if he was 77 he would be at 75th percentile for age/ethnicity matched cohorts. Denies angina pectoris or heart failure symptoms. EKG: Nonischemic Was placed on both aspirin and statin therapy prior to establishing care with cardiology. After being on Crestor for 6 weeks patient had repeat labs which were independently reviewed at today's office visit.  His LDL levels have improved significantly.  See below Echo and stress test results reviewed independently with the patient at today's office visit. Educated on the importance of secondary prevention. Encouraged increasing physical activity as tolerated with a goal of 30 minutes a day 5 days a week.  Pure hypercholesterolemia Indexed LDL 156 mg/dL as of March 2023 After being on Crestor 20 mg p.o. nightly patient's LDL levels improved to 50 mg/dL. Given his young age would like to minimize the dose of statin required to keep his cholesterol at acceptable levels. We will decrease her Crestor to 10 mg p.o. nightly and repeat labs in 6 weeks to reevaluate lipids and LFTs.  Patient's blood pressure is elevated at today's office visit likely secondary to anxiety.  This needs to be followed up as outpatient to see if he truly has benign essential hypertension warranting pharmacological therapy.  We will defer additional work-up and monitoring to PCP at this time.  FINAL MEDICATION LIST END OF ENCOUNTER: Meds ordered this encounter  Medications   rosuvastatin (CRESTOR) 10 MG tablet    Sig: Take 1 tablet (10 mg total) by mouth at bedtime.    Dispense:  90 tablet    Refill:  0    Medications Discontinued During This Encounter  Medication Reason   rosuvastatin (CRESTOR) 20 MG tablet Dose change     Current Outpatient Medications:    aspirin EC 81 MG tablet, 1 tablet Orally Once a day, Disp: , Rfl:    cetirizine (ZYRTEC) 10 MG chewable tablet,  Chew 10 mg by mouth as needed for allergies., Disp: , Rfl:    fluticasone (FLONASE) 50 MCG/ACT nasal spray, Place 1 spray into both nostrils as needed for allergies or rhinitis., Disp: , Rfl:    rosuvastatin (CRESTOR) 10 MG tablet, Take 1 tablet (10 mg total) by mouth at bedtime., Disp: 90 tablet, Rfl: 0  Orders Placed This Encounter  Procedures   Lipid Panel With LDL/HDL Ratio   LDL cholesterol, direct   CMP14+EGFR    There are no Patient Instructions on file for this visit.   --Continue cardiac medications as reconciled in final medication list. --Return in  about 1 year (around 08/12/2022) for Follow up, Coronary artery calcification. or sooner if needed. --Continue follow-up with your primary care physician regarding the management of your other chronic comorbid conditions.  Patient's questions and concerns were addressed to his satisfaction. He voices understanding of the instructions provided during this encounter.   This note was created using a voice recognition software as a result there may be grammatical errors inadvertently enclosed that do not reflect the nature of this encounter. Every attempt is made to correct such errors.  Rex Kras, [place], [facility]  Pager: [phone] Office: [phone]

## 2021-08-14 DIAGNOSIS — G4733 Obstructive sleep apnea (adult) (pediatric): Secondary | ICD-10-CM | POA: Diagnosis not present

## 2021-09-07 DIAGNOSIS — C44319 Basal cell carcinoma of skin of other parts of face: Secondary | ICD-10-CM | POA: Diagnosis not present

## 2021-09-07 DIAGNOSIS — G4733 Obstructive sleep apnea (adult) (pediatric): Secondary | ICD-10-CM | POA: Diagnosis not present

## 2021-09-21 ENCOUNTER — Other Ambulatory Visit: Payer: Self-pay | Admitting: Cardiology

## 2021-09-21 DIAGNOSIS — E7800 Pure hypercholesterolemia, unspecified: Secondary | ICD-10-CM | POA: Diagnosis not present

## 2021-09-21 DIAGNOSIS — I2510 Atherosclerotic heart disease of native coronary artery without angina pectoris: Secondary | ICD-10-CM | POA: Diagnosis not present

## 2021-09-21 DIAGNOSIS — R931 Abnormal findings on diagnostic imaging of heart and coronary circulation: Secondary | ICD-10-CM | POA: Diagnosis not present

## 2021-09-21 DIAGNOSIS — I2584 Coronary atherosclerosis due to calcified coronary lesion: Secondary | ICD-10-CM | POA: Diagnosis not present

## 2021-09-22 DIAGNOSIS — H524 Presbyopia: Secondary | ICD-10-CM | POA: Diagnosis not present

## 2021-09-22 DIAGNOSIS — H40053 Ocular hypertension, bilateral: Secondary | ICD-10-CM | POA: Diagnosis not present

## 2021-09-22 LAB — CMP14+EGFR
ALT: 23 IU/L (ref 0–44)
AST: 20 IU/L (ref 0–40)
Albumin/Globulin Ratio: 2.3 — ABNORMAL HIGH (ref 1.2–2.2)
Albumin: 4.9 g/dL (ref 4.1–5.1)
Alkaline Phosphatase: 74 IU/L (ref 44–121)
BUN/Creatinine Ratio: 11 (ref 9–20)
BUN: 10 mg/dL (ref 6–24)
Bilirubin Total: 0.5 mg/dL (ref 0.0–1.2)
CO2: 27 mmol/L (ref 20–29)
Calcium: 9.5 mg/dL (ref 8.7–10.2)
Chloride: 100 mmol/L (ref 96–106)
Creatinine, Ser: 0.94 mg/dL (ref 0.76–1.27)
Globulin, Total: 2.1 g/dL (ref 1.5–4.5)
Glucose: 102 mg/dL — ABNORMAL HIGH (ref 70–99)
Potassium: 4.5 mmol/L (ref 3.5–5.2)
Sodium: 138 mmol/L (ref 134–144)
Total Protein: 7 g/dL (ref 6.0–8.5)
eGFR: 104 mL/min/{1.73_m2} (ref >59)

## 2021-09-22 LAB — LIPID PANEL WITH LDL/HDL RATIO
Cholesterol, Total: 114 mg/dL (ref 100–199)
HDL: 37 mg/dL — ABNORMAL LOW (ref >39)
LDL Chol Calc (NIH): 64 mg/dL (ref 0–99)
LDL/HDL Ratio: 1.7 ratio (ref 0.0–3.6)
Triglycerides: 60 mg/dL (ref 0–149)
VLDL Cholesterol Cal: 13 mg/dL (ref 5–40)

## 2021-09-22 LAB — LDL CHOLESTEROL, DIRECT: LDL Direct: 64 mg/dL (ref 0–99)

## 2021-10-08 DIAGNOSIS — G4733 Obstructive sleep apnea (adult) (pediatric): Secondary | ICD-10-CM | POA: Diagnosis not present

## 2021-11-01 ENCOUNTER — Other Ambulatory Visit: Payer: Self-pay | Admitting: Cardiology

## 2021-11-01 DIAGNOSIS — R931 Abnormal findings on diagnostic imaging of heart and coronary circulation: Secondary | ICD-10-CM

## 2021-11-01 DIAGNOSIS — E7800 Pure hypercholesterolemia, unspecified: Secondary | ICD-10-CM

## 2021-11-01 DIAGNOSIS — I2510 Atherosclerotic heart disease of native coronary artery without angina pectoris: Secondary | ICD-10-CM

## 2021-11-06 DIAGNOSIS — G4733 Obstructive sleep apnea (adult) (pediatric): Secondary | ICD-10-CM | POA: Diagnosis not present

## 2021-12-07 DIAGNOSIS — G4733 Obstructive sleep apnea (adult) (pediatric): Secondary | ICD-10-CM | POA: Diagnosis not present

## 2022-01-06 DIAGNOSIS — G4733 Obstructive sleep apnea (adult) (pediatric): Secondary | ICD-10-CM | POA: Diagnosis not present

## 2022-02-08 ENCOUNTER — Other Ambulatory Visit: Payer: Self-pay

## 2022-02-08 ENCOUNTER — Other Ambulatory Visit: Payer: Self-pay | Admitting: Cardiology

## 2022-02-08 DIAGNOSIS — I2510 Atherosclerotic heart disease of native coronary artery without angina pectoris: Secondary | ICD-10-CM

## 2022-02-08 DIAGNOSIS — R931 Abnormal findings on diagnostic imaging of heart and coronary circulation: Secondary | ICD-10-CM

## 2022-02-08 DIAGNOSIS — E7800 Pure hypercholesterolemia, unspecified: Secondary | ICD-10-CM

## 2022-02-08 MED ORDER — ROSUVASTATIN CALCIUM 10 MG PO TABS: ORAL_TABLET | ORAL | 2 refills | Status: DC

## 2022-03-14 DIAGNOSIS — I2510 Atherosclerotic heart disease of native coronary artery without angina pectoris: Secondary | ICD-10-CM | POA: Diagnosis not present

## 2022-03-14 DIAGNOSIS — K5900 Constipation, unspecified: Secondary | ICD-10-CM | POA: Diagnosis not present

## 2022-03-22 ENCOUNTER — Other Ambulatory Visit: Payer: Self-pay | Admitting: Internal Medicine

## 2022-03-22 DIAGNOSIS — K5900 Constipation, unspecified: Secondary | ICD-10-CM

## 2022-03-22 DIAGNOSIS — R194 Change in bowel habit: Secondary | ICD-10-CM

## 2022-03-22 DIAGNOSIS — R195 Other fecal abnormalities: Secondary | ICD-10-CM

## 2022-03-28 DIAGNOSIS — K649 Unspecified hemorrhoids: Secondary | ICD-10-CM | POA: Diagnosis not present

## 2022-03-28 DIAGNOSIS — R195 Other fecal abnormalities: Secondary | ICD-10-CM | POA: Diagnosis not present

## 2022-04-03 ENCOUNTER — Telehealth: Payer: Self-pay

## 2022-04-03 NOTE — Telephone Encounter (Signed)
Called patient to schedule new patient visit per Dr.Cunningham.

## 2022-04-03 NOTE — Telephone Encounter (Signed)
Patient scheduled 3/1 at 9:10

## 2022-04-04 ENCOUNTER — Inpatient Hospital Stay: Admission: RE | Admit: 2022-04-04 | Payer: BC Managed Care – PPO | Source: Ambulatory Visit

## 2022-04-06 ENCOUNTER — Ambulatory Visit (INDEPENDENT_AMBULATORY_CARE_PROVIDER_SITE_OTHER): Payer: BC Managed Care – PPO | Admitting: Gastroenterology

## 2022-04-06 ENCOUNTER — Encounter: Payer: Self-pay | Admitting: Gastroenterology

## 2022-04-06 VITALS — BP 126/82 | HR 99 | Ht 75.0 in | Wt 191.0 lb

## 2022-04-06 DIAGNOSIS — R195 Other fecal abnormalities: Secondary | ICD-10-CM

## 2022-04-06 DIAGNOSIS — R194 Change in bowel habit: Secondary | ICD-10-CM

## 2022-04-06 DIAGNOSIS — K5900 Constipation, unspecified: Secondary | ICD-10-CM | POA: Diagnosis not present

## 2022-04-06 NOTE — Patient Instructions (Signed)
_______________________________________________________  If your blood pressure at your visit was 140/90 or greater, please contact your primary care physician to follow up on this.  _______________________________________________________  If you are age 43 or older, your body mass index should be between 23-30. Your Body mass index is 23.87 kg/m. If this is out of the aforementioned range listed, please consider follow up with your Primary Care Provider.  If you are age 22 or younger, your body mass index should be between 19-25. Your Body mass index is 23.87 kg/m. If this is out of the aformentioned range listed, please consider follow up with your Primary Care Provider.   You have been scheduled for a colonoscopy. Please follow written instructions given to you at your visit today.  Please pick up your prep supplies at the pharmacy within the next 1-3 days. If you use inhalers (even only as needed), please bring them with you on the day of your procedure.   The [facility] providers would like to encourage you to use [facility] to communicate with providers for non-urgent requests or questions.  Due to long hold times on the telephone, sending your provider a message by [facility] may be a faster and more efficient way to get a response.  Please allow 48 business hours for a response.  Please remember that this is for non-urgent requests.   It was a pleasure to see you today!  Thank you for trusting me with your gastrointestinal care!    Scott E.Candis Schatz, MD

## 2022-04-06 NOTE — Progress Notes (Signed)
HPI : Kristopher Bates is a very pleasant 42 year old male with hyperlipidemia who is referred to [place] by Dr. Alysia Penna for further evaluation of positive fecal occult blood test in the setting of a change in bowel habits and unintentional weight loss.  He states that in December he noticed that he some darker colored stools.  This resolved, but in January he developed rather abrupt and severe constipation, going a week without a bowel movement.  Prior to this he was having very regular bowel movements on a daily basis. He took multiple laxatives to include MiraLax and milk of magnesia and was able to produce some stool, but continued to struggle with infrequent bowel movements throughout January.   In February, he has been having more frequent bowel movements, but his stools are often thin and small volume.  He still has to strain sometimes with defecation.  He has not seen any overt blood in his stool.  He had been taking a daily fiber supplement, but was advised to stop recently.  He denies abdominal pain but reports that stomach has been making 'loud noises' frequently, which is new.  No nausea or vomiting.  He has developed symptoms consistent with an external hemorrhoid, characterized by a soft, painless tissue swelling around the anus.  T  He saw his PCP in early February and a fecal occult blood test was positive.  A CBC was notable for a hgb of 13 which is down from his baseline of 15.  A perianal exam was notable for an external hemorrhoid. The patient has lost weight over the past several months, but he attributes this to improvements in his diet (he was recently found to have very high coronary calcium score and has high cholesterol, and made concerted efforts to improve his diet and lower cholesterol). A CT of the abdomen/pelvis was ordered by the patient's PCP but was not approved by his insurance.  He has no family history of colon cancer.  Past Medical History:  Diagnosis Date    Coronary atherosclerosis due to calcified coronary lesion    Hyperlipidemia      No past surgical history on file. Family History  Problem Relation Age of Onset   Heart attack Paternal Uncle    Heart attack Paternal Uncle    Stroke Paternal Grandmother 49   Heart attack Paternal Grandfather    Social History   Tobacco Use   Smoking status: Never   Smokeless tobacco: Never  Vaping Use   Vaping Use: Never used  Substance Use Topics   Alcohol use: Yes    Comment: occ   Drug use: Never   Current Outpatient Medications  Medication Sig Dispense Refill   aspirin EC 81 MG tablet 1 tablet Orally Once a day     cetirizine (ZYRTEC) 10 MG chewable tablet Chew 10 mg by mouth as needed for allergies.     fluticasone (FLONASE) 50 MCG/ACT nasal spray Place 1 spray into both nostrils as needed for allergies or rhinitis.     rosuvastatin (CRESTOR) 10 MG tablet TAKE 1 TABLET(10 MG) BY MOUTH AT BEDTIME 90 tablet 2   No current facility-administered medications for this visit.   Allergies  Allergen Reactions   Penicillins     Childhood allergy     Review of Systems: All systems reviewed and negative except where noted in HPI.    No results found.  Physical Exam: BP 126/82   Pulse 99   Ht 6\' 3"  (1.905 m)  Wt 191 lb (86.6 kg)   BMI 23.87 kg/m  Constitutional: Pleasant,well-developed, Caucasian male in no acute distress. HEENT: Normocephalic and atraumatic. Conjunctivae are normal. No scleral icterus. Cardiovascular: Normal rate, regular rhythm.  Pulmonary/chest: Effort normal and breath sounds normal. No wheezing, rales or rhonchi. Abdominal: Soft, nondistended, nontender. Bowel sounds active throughout. There are no masses palpable. No hepatomegaly. Extremities: no edema Neurological: Alert and oriented to person place and time. Skin: Skin is warm and dry. No rashes noted. Psychiatric: Normal mood and affect. Behavior is normal.  CBC No results found for: "WBC", "RBC",  "HGB", "HCT", "PLT", "MCV", "MCH", "MCHC", "RDW", "LYMPHSABS", "MONOABS", "EOSABS", "BASOSABS"  CMP     Component Value Date/Time   NA 138 09/21/2021 0829   K 4.5 09/21/2021 0829   CL 100 09/21/2021 0829   CO2 27 09/21/2021 0829   GLUCOSE 102 (H) 09/21/2021 0829   BUN 10 09/21/2021 0829   CREATININE 0.94 09/21/2021 0829   CALCIUM 9.5 09/21/2021 0829   PROT 7.0 09/21/2021 0829   ALBUMIN 4.9 09/21/2021 0829   AST 20 09/21/2021 0829   ALT 23 09/21/2021 0829   ALKPHOS 74 09/21/2021 0829   BILITOT 0.5 09/21/2021 0829     ASSESSMENT AND PLAN:  42 year old male with recent change in bowel habits characterized by constipation and change in stool caliber and positive fecal occult blood test and a 2 point drop in hemoglobin from baseline.  He has also lost 10-15 lbs over the past year, but this can be attributed to improvements in diet.  A colonoscopy is warranted to exclude neoplasm.  Positive hemoccult stool/Change in bowel habits - Colonoscopy  The details, risks (including bleeding, perforation, infection, missed lesions, medication reactions and possible hospitalization or surgery if complications occur), benefits, and alternatives to colonoscopy with possible biopsy and possible polypectomy were discussed with the patient and he consents to proceed.   Osaze Hubbert E. Tomasa Rand, MD [facility]    Alysia Penna, MD

## 2022-04-09 ENCOUNTER — Encounter: Payer: Self-pay | Admitting: Gastroenterology

## 2022-04-09 ENCOUNTER — Ambulatory Visit (AMBULATORY_SURGERY_CENTER): Payer: BC Managed Care – PPO | Admitting: Gastroenterology

## 2022-04-09 VITALS — BP 126/77 | HR 87 | Temp 98.4°F | Resp 9 | Ht 75.0 in | Wt 191.0 lb

## 2022-04-09 DIAGNOSIS — D122 Benign neoplasm of ascending colon: Secondary | ICD-10-CM

## 2022-04-09 DIAGNOSIS — D125 Benign neoplasm of sigmoid colon: Secondary | ICD-10-CM | POA: Diagnosis not present

## 2022-04-09 DIAGNOSIS — G4733 Obstructive sleep apnea (adult) (pediatric): Secondary | ICD-10-CM | POA: Diagnosis not present

## 2022-04-09 DIAGNOSIS — D123 Benign neoplasm of transverse colon: Secondary | ICD-10-CM

## 2022-04-09 DIAGNOSIS — R195 Other fecal abnormalities: Secondary | ICD-10-CM | POA: Diagnosis not present

## 2022-04-09 DIAGNOSIS — K5900 Constipation, unspecified: Secondary | ICD-10-CM

## 2022-04-09 DIAGNOSIS — R194 Change in bowel habit: Secondary | ICD-10-CM

## 2022-04-09 DIAGNOSIS — K635 Polyp of colon: Secondary | ICD-10-CM | POA: Diagnosis not present

## 2022-04-09 HISTORY — PX: COLONOSCOPY: SHX174

## 2022-04-09 MED ORDER — SODIUM CHLORIDE 0.9 % IV SOLN: 500.0000 mL | Freq: Once | INTRAVENOUS | Status: DC

## 2022-04-09 NOTE — Op Note (Signed)
[facility] Patient Name: Kristopher Bates Procedure Date: 04/09/2022 2:05 PM MRN: [id number] Endoscopist: Nicki Reaper E. Candis Schatz , MD, [id number] Age: 42 Referring MD:  Date of Birth: Jan 08, 1981 Gender: Male Account #: [id number] Procedure:                Colonoscopy Indications:              Heme positive stool, Change in bowel habits Medicines:                Monitored Anesthesia Care Procedure:                Pre-Anesthesia Assessment:                           - Prior to the procedure, a History and Physical                            was performed, and patient medications and                            allergies were reviewed. The patient's tolerance of                            previous anesthesia was also reviewed. The risks                            and benefits of the procedure and the sedation                            options and risks were discussed with the patient.                            All questions were answered, and informed consent                            was obtained. Prior Anticoagulants: The patient has                            taken no anticoagulant or antiplatelet agents. ASA                            Grade Assessment: II - A patient with mild systemic                            disease. After reviewing the risks and benefits,                            the patient was deemed in satisfactory condition to                            undergo the procedure.                           After obtaining informed consent, the colonoscope  was passed under direct vision. Throughout the                            procedure, the patient's blood pressure, pulse, and                            oxygen saturations were monitored continuously. The                            CF [id number] was introduced through the anus                            and advanced to the the terminal ileum, with                             identification of the appendiceal orifice and IC                            valve. The colonoscopy was performed without                            difficulty. The patient tolerated the procedure                            well. The quality of the bowel preparation was                            good. The terminal ileum, ileocecal valve,                            appendiceal orifice, and rectum were photographed. Scope In: 2:26:09 PM Scope Out: 2:47:38 PM Scope Withdrawal Time: 0 hours 16 minutes 20 seconds  Total Procedure Duration: 0 hours 21 minutes 29 seconds  Findings:                 The perianal and digital rectal examinations were                            normal. Pertinent negatives include normal                            sphincter tone and no palpable rectal lesions.                           A 6 mm polyp was found in the ascending colon. The                            polyp was sessile. The polyp was removed with a                            cold snare. Resection and retrieval were complete.                            Estimated blood  loss was minimal.                           Two sessile polyps were found in the splenic                            flexure. The polyps were 3 to 4 mm in size. These                            polyps were removed with a cold snare. Resection                            and retrieval were complete. Estimated blood loss                            was minimal.                           A 3 mm polyp was found in the sigmoid colon. The                            polyp was sessile. The polyp was removed with a                            cold snare. Resection and retrieval were complete.                            Estimated blood loss was minimal.                           Multiple medium-mouthed and small-mouthed                            diverticula were found in the sigmoid colon and                            descending colon. There was no  evidence of                            diverticular bleeding.                           The exam was otherwise normal throughout the                            examined colon.                           The terminal ileum appeared normal.                           The retroflexed view of the distal rectum and anal                            verge was normal and  showed no anal or rectal                            abnormalities. Complications:            No immediate complications. Estimated Blood Loss:     Estimated blood loss: none. Impression:               - One 6 mm polyp in the ascending colon, removed                            with a cold snare. Resected and retrieved.                           - Two 3 to 4 mm polyps at the splenic flexure,                            removed with a cold snare. Resected and retrieved.                           - One 3 mm polyp in the sigmoid colon, removed with                            a cold snare. Resected and retrieved.                           - Mild diverticulosis in the sigmoid colon and in                            the descending colon. There was no evidence of                            diverticular bleeding.                           - The examined portion of the ileum was normal.                           - The distal rectum and anal verge are normal on                            retroflexion view.                           - No endoscopic abnormalities to explain change in                            bowel habits or heme-positive stool. Suspect false                            positive Recommendation:           - Patient has a contact number available for  emergencies. The signs and symptoms of potential                            delayed complications were discussed with the                            patient. Return to normal activities tomorrow.                            Written discharge instructions were  provided to the                            patient.                           - Resume previous diet.                           - Continue present medications.                           - Await pathology results.                           - Repeat colonoscopy (date not yet determined) for                            surveillance based on pathology results.                           - Resume daily fiber supplement Evelyn Moch E. Candis Schatz, MD 04/09/2022 2:56:31 PM This report has been signed electronically.

## 2022-04-09 NOTE — Progress Notes (Signed)
History and Physical Interval Note:  04/09/2022 2:12 PM  Kristopher Bates.  has presented today for endoscopic procedure(s), with the diagnosis of  Encounter Diagnoses  Name Primary?   Constipation, unspecified constipation type Yes   Change in bowel habits    Heme positive stool   .  The various methods of evaluation and treatment have been discussed with the patient and/or family. After consideration of risks, benefits and other options for treatment, the patient has consented to  the endoscopic procedure(s).   The patient's history has been reviewed, patient examined, no change in status, stable for endoscopic procedure(s).  I have reviewed the patient's chart and labs.  Questions were answered to the patient's satisfaction.     Hughie Melroy E. Candis Schatz, MD [facility]

## 2022-04-09 NOTE — Progress Notes (Signed)
Pt's states no medical or surgical changes since previsit or office visit. 

## 2022-04-09 NOTE — Progress Notes (Signed)
A and O x3. Report to RN. Tolerated MAC anesthesia well. 

## 2022-04-09 NOTE — Patient Instructions (Addendum)
Thank you for coming in to see [place] today. Res Resume fiber supplements  Return to regular daily activities tomorrow. Await biopsy results, approximately 2 weeks.  Will recommend next colonoscopy at that time.   YOU HAD AN ENDOSCOPIC PROCEDURE TODAY AT [facility]:   Refer to the procedure report that was given to you for any specific questions about what was found during the examination.  If the procedure report does not answer your questions, please call your gastroenterologist to clarify.  If you requested that your care partner not be given the details of your procedure findings, then the procedure report has been included in a sealed envelope for you to review at your convenience later.  YOU SHOULD EXPECT: Some feelings of bloating in the abdomen. Passage of more gas than usual.  Walking can help get rid of the air that was put into your GI tract during the procedure and reduce the bloating. If you had a lower endoscopy (such as a colonoscopy or flexible sigmoidoscopy) you may notice spotting of blood in your stool or on the toilet paper. If you underwent a bowel prep for your procedure, you may not have a normal bowel movement for a few days.  Please Note:  You might notice some irritation and congestion in your nose or some drainage.  This is from the oxygen used during your procedure.  There is no need for concern and it should clear up in a day or so.  SYMPTOMS TO REPORT IMMEDIATELY:  Following lower endoscopy (colonoscopy or flexible sigmoidoscopy):  Excessive amounts of blood in the stool  Significant tenderness or worsening of abdominal pains  Swelling of the abdomen that is new, acute  Fever of 100F or higher    For urgent or emergent issues, a gastroenterologist can be reached at any hour by calling [phone]. Do not use MyChart messaging for urgent concerns.    DIET:  We do recommend a small meal at first, but then you may proceed to your regular diet.   Drink plenty of fluids but you should avoid alcoholic beverages for 24 hours.  ACTIVITY:  You should plan to take it easy for the rest of today and you should NOT DRIVE or use heavy machinery until tomorrow (because of the sedation medicines used during the test).    FOLLOW UP: Our staff will call the number listed on your records the next business day following your procedure.  We will call around 7:15- 8:00 am to check on you and address any questions or concerns that you may have regarding the information given to you following your procedure. If we do not reach you, we will leave a message.     If any biopsies were taken you will be contacted by phone or by letter within the next 1-3 weeks.  Please call us at [phone] if you have not heard about the biopsies in 3 weeks.    SIGNATURES/CONFIDENTIALITY: You and/or your care partner have signed paperwork which will be entered into your electronic medical record.  These signatures attest to the fact that that the information above on your After Visit Summary has been reviewed and is understood.  Full responsibility of the confidentiality of this discharge information lies with you and/or your care-partner.

## 2022-04-09 NOTE — Progress Notes (Signed)
Called to room to assist during endoscopic procedure.  Patient ID and intended procedure confirmed with present staff. Received instructions for my participation in the procedure from the performing physician.  

## 2022-04-10 ENCOUNTER — Telehealth: Payer: Self-pay

## 2022-04-10 NOTE — Telephone Encounter (Signed)
  Follow up Call-     04/09/2022    1:33 PM  Call back number  Post procedure Call Back phone  # [phone]  Permission to leave phone message Yes     Patient questions:  Do you have a fever, pain , or abdominal swelling? No. Pain Score  0 *  Have you tolerated food without any problems? Yes.    Have you been able to return to your normal activities? Yes.    Do you have any questions about your discharge instructions: Diet   No. Medications  No. Follow up visit  No.  Do you have questions or concerns about your Care? No.  Actions: * If pain score is 4 or above: No action needed, pain <4.

## 2022-04-15 NOTE — Progress Notes (Signed)
Kristopher Bates,  All four polyps removed were hyperplastic polyps.  Hyperplastic polyps are generally not considered to be precancerous.  However, hyperplastic polyps in the proximal colon can be very difficult to distinguish histologically from sessile serrated polyps, which are considered precancerous. In these situations, it is common to consider hyperplastic polyps in the proximal colon as sessile serrated polyps.  For this reason, I would recommend you repeat a colonoscopy in 7 years instead of 10 years.

## 2022-05-10 DIAGNOSIS — G4733 Obstructive sleep apnea (adult) (pediatric): Secondary | ICD-10-CM | POA: Diagnosis not present

## 2022-05-22 DIAGNOSIS — I2510 Atherosclerotic heart disease of native coronary artery without angina pectoris: Secondary | ICD-10-CM | POA: Diagnosis not present

## 2022-05-22 DIAGNOSIS — R7989 Other specified abnormal findings of blood chemistry: Secondary | ICD-10-CM | POA: Diagnosis not present

## 2022-05-22 DIAGNOSIS — Z125 Encounter for screening for malignant neoplasm of prostate: Secondary | ICD-10-CM | POA: Diagnosis not present

## 2022-05-22 DIAGNOSIS — E785 Hyperlipidemia, unspecified: Secondary | ICD-10-CM | POA: Diagnosis not present

## 2022-05-29 DIAGNOSIS — Z0000 Encounter for general adult medical examination without abnormal findings: Secondary | ICD-10-CM | POA: Diagnosis not present

## 2022-05-29 DIAGNOSIS — Z1331 Encounter for screening for depression: Secondary | ICD-10-CM | POA: Diagnosis not present

## 2022-05-29 DIAGNOSIS — Z1339 Encounter for screening examination for other mental health and behavioral disorders: Secondary | ICD-10-CM | POA: Diagnosis not present

## 2022-05-31 ENCOUNTER — Encounter: Payer: Self-pay | Admitting: Cardiology

## 2022-06-04 NOTE — Progress Notes (Signed)
External Labs: Collected: 05/22/2022 provided by PCP. Hemoglobin 14.7, hematocrit 41.5% BUN 13, creatinine 0.7. Sodium 136, potassium 4.1, chloride 103, bicarb 24 AST 19, ALT 24, alkaline phosphatase 50 Total cholesterol 110, triglycerides 59, HDL 38, LDL 60, non-HDL 72 TSH 1.06

## 2022-06-09 DIAGNOSIS — G4733 Obstructive sleep apnea (adult) (pediatric): Secondary | ICD-10-CM | POA: Diagnosis not present

## 2022-06-14 ENCOUNTER — Encounter: Payer: Self-pay | Admitting: Cardiology

## 2022-06-14 ENCOUNTER — Ambulatory Visit: Payer: BC Managed Care – PPO | Admitting: Cardiology

## 2022-06-14 VITALS — BP 155/92 | HR 85 | Resp 16 | Ht 75.0 in | Wt 191.8 lb

## 2022-06-14 DIAGNOSIS — R931 Abnormal findings on diagnostic imaging of heart and coronary circulation: Secondary | ICD-10-CM | POA: Diagnosis not present

## 2022-06-14 NOTE — Progress Notes (Signed)
ID:  Hinda Kehr., DOB Dec 24, 1980, MRN [id number]  PCP:  Alysia Penna, MD  Cardiologist:  Tessa Lerner, DO, [facility] (established care 06/30/2021)  Date: 06/14/22 Last Office Visit: 08/11/2021  Chief Complaint  Patient presents with   Coronary Artery Calcification   Follow-up    HPI  Kristopher Bates. is a 42 y.o. Caucasian male whose past medical history and cardiovascular risk factors include: Severe coronary artery calcification (at age of 45 total CAC 431, placing him at the 99th percentile (if he was 42years of age)), sleep apnea on CPAP, family history of heart disease.  Patient was referred to the practice for evaluation of severe coronary artery calcification.  He has undergone ischemic workup as outlined below.  This started on aspirin and statin therapy.  His LDL levels have improved.  He presents today for 1 year follow-up visit.  He denies anginal chest pain or heart failure symptoms.  He is busy taking care of his kids and always on the go but no structured exercise program or daily routine.  Office blood pressures are not at goal but they have been better at other offices.  He does not check them at home.  FUNCTIONAL STATUS: No structured exercise program or daily routine.   ALLERGIES: Allergies  Allergen Reactions   Penicillins     Childhood allergy    MEDICATION LIST PRIOR TO VISIT: Current Meds  Medication Sig   aspirin EC 81 MG tablet 1 tablet Orally Once a day   cetirizine (ZYRTEC) 10 MG chewable tablet Chew 10 mg by mouth as needed for allergies.   fluticasone (FLONASE) 50 MCG/ACT nasal spray Place 1 spray into both nostrils as needed for allergies or rhinitis.   Multiple Vitamins-Minerals (DAILY MULTIVITAMIN) CAPS Take 1 cap daily Oral   Probiotic Product (PROBIOTIC DAILY PO) Take by mouth.   rosuvastatin (CRESTOR) 10 MG tablet TAKE 1 TABLET(10 MG) BY MOUTH AT BEDTIME     PAST MEDICAL HISTORY: Past Medical History:  Diagnosis Date    Coronary atherosclerosis due to calcified coronary lesion    Hyperlipidemia     PAST SURGICAL HISTORY: Past Surgical History:  Procedure Laterality Date   COLONOSCOPY  04/09/2022   skin spots removed     WISDOM TOOTH EXTRACTION      FAMILY HISTORY: The patient family history includes Heart attack in his paternal grandfather, paternal uncle, and paternal uncle; Stroke (age of onset: 71) in his paternal grandmother.  SOCIAL HISTORY:  The patient  reports that he has never smoked. He has never used smokeless tobacco. He reports current alcohol use. He reports that he does not use drugs.  REVIEW OF SYSTEMS: Review of Systems  Cardiovascular:  Negative for chest pain, dyspnea on exertion, leg swelling, near-syncope, orthopnea, palpitations, paroxysmal nocturnal dyspnea and syncope.  Respiratory:  Negative for shortness of breath.     PHYSICAL EXAM:    06/14/2022    8:46 AM 04/09/2022    3:14 PM 04/09/2022    3:00 PM  Vitals with BMI  Height 6\' 3"     Weight 191 lbs 13 oz    BMI 23.97    Systolic 155 126 119  Diastolic 92 77 56  Pulse 85 87 96    Physical Exam  Constitutional: No distress.  Age appropriate, hemodynamically stable.   Neck: No JVD present.  Cardiovascular: Normal rate, regular rhythm, S1 normal, S2 normal, intact distal pulses and normal pulses. Exam reveals no gallop, no S3 and no S4.  No murmur heard. Pulmonary/Chest: Effort normal and breath sounds normal. No stridor. He has no wheezes. He has no rales.  Abdominal: Soft. Bowel sounds are normal. He exhibits no distension. There is no abdominal tenderness.  Musculoskeletal:        General: No edema.     Cervical back: Neck supple.  Neurological: He is alert and oriented to person, place, and time. He has intact cranial nerves (2-12).  Skin: Skin is warm and moist.   CARDIAC DATABASE: EKG: 06/14/2022: Sinus rhythm with sinus arrhythmia, 84 bpm, normal axis, LAE, without underlying ischemia or injury  pattern.  Echocardiogram: 07/12/2021:  Normal LV systolic function with visual EF 60-65%. Left ventricle cavity is normal in size. Normal left ventricular wall thickness. Normal global wall motion. Normal diastolic filling pattern, normal LAP.  Trace tricuspid regurgitation. No evidence of pulmonary hypertension.  No prior study for comparison.   Stress Testing: Exercise Sestamibi stress test 07/24/2021: Exercise nuclear stress test was performed using Bruce protocol.   1 Day Rest and Stress images. Exercise time 7 minutes 10 seconds, achieved 8.86 METS, 87% APMHR.  Stress ECG negative for ischemia.   Normal myocardial perfusion without convincing evidence of reversible myocardial ischemia or prior infarct. Calculated LVEF 62%, left ventricular size and wall thickness preserved, no obvious regional wall motion abnormalities. Low risk study.   Calcium Scoring: 06/07/2021 Left Main: 24 LAD: 312 LCx: 14 RCA: 81 Total Agatston Score: 431   MESA database percentile: 99th (for a 21 year Caucasian old male) 1. Patient's total coronary artery calcium score is 431 which is ninety-ninth percentile for 42 year old Caucasian male (the reference MESA database does not include individuals < 13 years of age). Please note that although the presence of coronary artery calcium documents the presence of coronary artery disease, the severity of this disease and any potential stenosis cannot be assessed on this noncontrast CT examination. Assessment for potential risk factor modification, dietary therapy or pharmacologic therapy may be warranted, if clinically indicated. 2. Hepatic steatosis.  Heart Catheterization: None  LABORATORY DATA: External Labs: Collected: April 25, 2021 provided by referring physician. Sodium 138, potassium 4.4, chloride 100, bicarb 30 BUN 12, creatinine 0.8. eGFR >60 mL/min/m AST 18, ALT 29, alkaline phosphatase 67. Hemoglobin 15.5 g/dL, hematocrit 16.1% Total  cholesterol 209, triglycerides 106, HDL 32, LDL 156, non-HDL 177. TSH 0.84  External Labs: Collected: 05/22/2022 provided by PCP. Hemoglobin 14.7, hematocrit 41.5% BUN 13, creatinine 0.7. Sodium 136, potassium 4.1, chloride 103, bicarb 24 AST 19, ALT 24, alkaline phosphatase 50 Total cholesterol 110, triglycerides 59, HDL 38, LDL 60, non-HDL 72 TSH 1.06  LABORATORY DATA:     Latest Ref Rng & Units 09/21/2021    8:29 AM 08/02/2021    9:15 AM  CMP  Glucose 70 - 99 mg/dL 096  92   BUN 6 - 24 mg/dL 10  12   Creatinine 0.45 - 1.27 mg/dL 4.09  8.11   Sodium 914 - 144 mmol/L 138  141   Potassium 3.5 - 5.2 mmol/L 4.5  4.5   Chloride 96 - 106 mmol/L 100  102   CO2 20 - 29 mmol/L 27  23   Calcium 8.7 - 10.2 mg/dL 9.5  9.5   Total Protein 6.0 - 8.5 g/dL 7.0  6.9   Total Bilirubin 0.0 - 1.2 mg/dL 0.5  0.5   Alkaline Phos 44 - 121 IU/L 74  71   AST 0 - 40 IU/L 20  20   ALT 0 -  44 IU/L 23  26     Lipid Panel     Component Value Date/Time   CHOL 114 09/21/2021 0829   TRIG 60 09/21/2021 0829   HDL 37 (L) 09/21/2021 0829   LDLCALC 64 09/21/2021 0829   LDLDIRECT 64 09/21/2021 0829   LABVLDL 13 09/21/2021 0829   IMPRESSION:    ICD-10-CM   1. Agatston CAC score, >400  R93.1 EKG 12-Lead       RECOMMENDATIONS: Burel Lindstrom. is a 42 y.o. Caucasian male whose past medical history and cardiac risk factors include: Severe coronary artery calcification, sleep apnea on CPAP, family history of heart disease.  Coronary atherosclerosis due to calcified coronary lesion / Agatston CAC score, >400 Total CAC 431 AU, his current age is 1; however, if he was 25 he would be at 99th percentile for age/ethnicity matched cohorts. Denies angina pectoris or heart failure symptoms. EKG: Nonischemic Continue aspirin and statin therapy. Prior ischemic workup reviewed Educated on the importance of secondary prevention. Encouraged increasing physical activity as tolerated with a goal of 30 minutes  a day 5 days a week.  Pure hypercholesterolemia Indexed LDL 156 mg/dL as of March 2023 Most recent lipids from April 2024 noted to LDL to be 60 mg/dL. Ideally would like to him to have LDL <55 mg/dL.  Office blood pressures are not at goal. Have asked him to keep a log of his blood pressures at home and if SBP is greater than 120 mmHg consistently he should be evaluated for possible pharmacological therapy.  In addition, he could benefit from reducing salt in his diet.  Discussed management of related to chronic comorbid conditions, reviewed outside labs from April 2024, EKG performed and independently reviewed.  FINAL MEDICATION LIST END OF ENCOUNTER: No orders of the defined types were placed in this encounter.   There are no discontinued medications.    Current Outpatient Medications:    aspirin EC 81 MG tablet, 1 tablet Orally Once a day, Disp: , Rfl:    cetirizine (ZYRTEC) 10 MG chewable tablet, Chew 10 mg by mouth as needed for allergies., Disp: , Rfl:    fluticasone (FLONASE) 50 MCG/ACT nasal spray, Place 1 spray into both nostrils as needed for allergies or rhinitis., Disp: , Rfl:    Multiple Vitamins-Minerals (DAILY MULTIVITAMIN) CAPS, Take 1 cap daily Oral, Disp: , Rfl:    Probiotic Product (PROBIOTIC DAILY PO), Take by mouth., Disp: , Rfl:    rosuvastatin (CRESTOR) 10 MG tablet, TAKE 1 TABLET(10 MG) BY MOUTH AT BEDTIME, Disp: 90 tablet, Rfl: 2  Orders Placed This Encounter  Procedures   EKG 12-Lead    There are no Patient Instructions on file for this visit.   --Continue cardiac medications as reconciled in final medication list. --Return in about 1 year (around 06/14/2023) for Follow up, Coronary artery calcification. or sooner if needed. --Continue follow-up with your primary care physician regarding the management of your other chronic comorbid conditions.  Patient's questions and concerns were addressed to his satisfaction. He voices understanding of the instructions  provided during this encounter.   This note was created using a voice recognition software as a result there may be grammatical errors inadvertently enclosed that do not reflect the nature of this encounter. Every attempt is made to correct such errors.  Tessa Lerner, [place], [facility]  Pager:  [phone] Office: [phone]

## 2022-07-09 DIAGNOSIS — G4733 Obstructive sleep apnea (adult) (pediatric): Secondary | ICD-10-CM | POA: Diagnosis not present

## 2022-07-11 DIAGNOSIS — D485 Neoplasm of uncertain behavior of skin: Secondary | ICD-10-CM | POA: Diagnosis not present

## 2022-07-11 DIAGNOSIS — L814 Other melanin hyperpigmentation: Secondary | ICD-10-CM | POA: Diagnosis not present

## 2022-07-11 DIAGNOSIS — G4733 Obstructive sleep apnea (adult) (pediatric): Secondary | ICD-10-CM | POA: Diagnosis not present

## 2022-07-11 DIAGNOSIS — D225 Melanocytic nevi of trunk: Secondary | ICD-10-CM | POA: Diagnosis not present

## 2022-07-11 DIAGNOSIS — Z85828 Personal history of other malignant neoplasm of skin: Secondary | ICD-10-CM | POA: Diagnosis not present

## 2022-08-08 DIAGNOSIS — G4733 Obstructive sleep apnea (adult) (pediatric): Secondary | ICD-10-CM | POA: Diagnosis not present

## 2022-09-08 DIAGNOSIS — G4733 Obstructive sleep apnea (adult) (pediatric): Secondary | ICD-10-CM | POA: Diagnosis not present

## 2022-09-28 DIAGNOSIS — H40013 Open angle with borderline findings, low risk, bilateral: Secondary | ICD-10-CM | POA: Diagnosis not present

## 2022-10-10 DIAGNOSIS — G4733 Obstructive sleep apnea (adult) (pediatric): Secondary | ICD-10-CM | POA: Diagnosis not present

## 2022-10-11 DIAGNOSIS — G4733 Obstructive sleep apnea (adult) (pediatric): Secondary | ICD-10-CM | POA: Diagnosis not present

## 2023-01-28 ENCOUNTER — Other Ambulatory Visit: Payer: Self-pay | Admitting: Cardiology

## 2023-01-28 DIAGNOSIS — E7800 Pure hypercholesterolemia, unspecified: Secondary | ICD-10-CM

## 2023-01-28 DIAGNOSIS — R931 Abnormal findings on diagnostic imaging of heart and coronary circulation: Secondary | ICD-10-CM

## 2023-01-28 DIAGNOSIS — I2510 Atherosclerotic heart disease of native coronary artery without angina pectoris: Secondary | ICD-10-CM

## 2023-04-19 ENCOUNTER — Telehealth: Payer: Self-pay | Admitting: Cardiology

## 2023-04-19 DIAGNOSIS — R931 Abnormal findings on diagnostic imaging of heart and coronary circulation: Secondary | ICD-10-CM

## 2023-04-19 DIAGNOSIS — E7800 Pure hypercholesterolemia, unspecified: Secondary | ICD-10-CM

## 2023-04-19 DIAGNOSIS — I2510 Atherosclerotic heart disease of native coronary artery without angina pectoris: Secondary | ICD-10-CM

## 2023-04-19 MED ORDER — ROSUVASTATIN CALCIUM 10 MG PO TABS
ORAL_TABLET | ORAL | 0 refills | Status: AC
Start: 2023-04-19 — End: ?

## 2023-04-19 NOTE — Telephone Encounter (Signed)
 Pt's medication was sent to pt's pharmacy as requested. Confirmation received.

## 2023-04-19 NOTE — Telephone Encounter (Signed)
*  STAT* If patient is at the pharmacy, call can be transferred to refill team.   1. Which medications need to be refilled? (please list name of each medication and dose if known)   rosuvastatin (CRESTOR) 10 MG tablet   2. Would you like to learn more about the convenience, safety, & potential cost savings by using the [facility]?   3. Are you open to using the [facility] (Type [facility].)    4. Which pharmacy/location (including street and city if local pharmacy) is medication to be sent to?  [facility] - [place], [place] - [address]   5. Do they need a 30 day or 90 day supply?   90 day  Patient stated he still has some medication left and is changing to [facility] pharmacy. Patient stated when filling this prescription will need to include his email - [email or web address].

## 2023-04-23 DIAGNOSIS — G4733 Obstructive sleep apnea (adult) (pediatric): Secondary | ICD-10-CM | POA: Diagnosis not present

## 2023-06-05 DIAGNOSIS — E785 Hyperlipidemia, unspecified: Secondary | ICD-10-CM | POA: Diagnosis not present

## 2023-06-05 DIAGNOSIS — Z125 Encounter for screening for malignant neoplasm of prostate: Secondary | ICD-10-CM | POA: Diagnosis not present

## 2023-06-12 DIAGNOSIS — Z0000 Encounter for general adult medical examination without abnormal findings: Secondary | ICD-10-CM | POA: Diagnosis not present

## 2023-06-12 DIAGNOSIS — R300 Dysuria: Secondary | ICD-10-CM | POA: Diagnosis not present

## 2023-06-12 DIAGNOSIS — I2510 Atherosclerotic heart disease of native coronary artery without angina pectoris: Secondary | ICD-10-CM | POA: Diagnosis not present

## 2023-06-12 DIAGNOSIS — Z1331 Encounter for screening for depression: Secondary | ICD-10-CM | POA: Diagnosis not present

## 2023-06-12 DIAGNOSIS — Z1339 Encounter for screening examination for other mental health and behavioral disorders: Secondary | ICD-10-CM | POA: Diagnosis not present

## 2023-06-14 ENCOUNTER — Ambulatory Visit: Payer: Self-pay | Admitting: Cardiology

## 2023-06-19 ENCOUNTER — Other Ambulatory Visit: Payer: Self-pay | Admitting: Internal Medicine

## 2023-06-19 ENCOUNTER — Ambulatory Visit
Admission: RE | Admit: 2023-06-19 | Discharge: 2023-06-19 | Disposition: A | Discharge status: Home / Self Care | Source: Ambulatory Visit | Attending: Internal Medicine | Admitting: Internal Medicine

## 2023-06-19 DIAGNOSIS — R300 Dysuria: Secondary | ICD-10-CM

## 2023-06-19 DIAGNOSIS — R3129 Other microscopic hematuria: Secondary | ICD-10-CM

## 2023-06-19 DIAGNOSIS — K5730 Diverticulosis of large intestine without perforation or abscess without bleeding: Secondary | ICD-10-CM | POA: Diagnosis not present

## 2023-06-19 DIAGNOSIS — N200 Calculus of kidney: Secondary | ICD-10-CM | POA: Diagnosis not present

## 2023-06-19 DIAGNOSIS — I700 Atherosclerosis of aorta: Secondary | ICD-10-CM | POA: Diagnosis not present

## 2023-06-19 DIAGNOSIS — R319 Hematuria, unspecified: Secondary | ICD-10-CM | POA: Diagnosis not present

## 2023-07-19 ENCOUNTER — Encounter: Payer: Self-pay | Admitting: Cardiology

## 2023-07-19 ENCOUNTER — Ambulatory Visit: Attending: Cardiology | Admitting: Cardiology

## 2023-07-19 VITALS — BP 160/94 | HR 80 | Resp 16 | Ht 75.0 in | Wt 202.2 lb

## 2023-07-19 DIAGNOSIS — R030 Elevated blood-pressure reading, without diagnosis of hypertension: Secondary | ICD-10-CM | POA: Diagnosis not present

## 2023-07-19 DIAGNOSIS — I2584 Coronary atherosclerosis due to calcified coronary lesion: Secondary | ICD-10-CM

## 2023-07-19 DIAGNOSIS — R931 Abnormal findings on diagnostic imaging of heart and coronary circulation: Secondary | ICD-10-CM | POA: Diagnosis not present

## 2023-07-19 DIAGNOSIS — I2510 Atherosclerotic heart disease of native coronary artery without angina pectoris: Secondary | ICD-10-CM | POA: Diagnosis not present

## 2023-07-19 DIAGNOSIS — E7800 Pure hypercholesterolemia, unspecified: Secondary | ICD-10-CM | POA: Diagnosis not present

## 2023-07-19 MED ORDER — ROSUVASTATIN CALCIUM 10 MG PO TABS
ORAL_TABLET | ORAL | 3 refills | Status: AC
Start: 2023-07-19 — End: ?

## 2023-07-19 NOTE — Progress Notes (Signed)
 Cardiology Office Note:  .   Date:  07/19/2023  ID:  Kristopher Reveal., DOB [birth year], MRN [id number] PCP:  Barnetta Liberty, MD  Former Cardiology Providers: NA [facility] Providers Cardiologist:  Olinda Bertrand, DO , [facility] (established care 06/30/2021) Electrophysiologist:  None  Click to update primary MD,subspecialty MD or APP then REFRESH:1}    Chief Complaint  Patient presents with   Coronary atherosclerosis due to calcified coronary lesion   Follow-up    History of Present Illness: .   Kristopher Hoback. is a 43 y.o. Caucasian male whose past medical history and cardiovascular risk factors includes: Severe coronary artery calcification (at age of 67 total CAC 431, placing him at the 99th percentile (if he was 43years of age)), sleep apnea on CPAP, family history of heart disease.   Patient was referred to the practice given his severe CAC at a relative young age.  He did undergo appropriate ischemic workup results noted below for further reference.  He was started on aspirin and statin therapy.  LDL levels have improved.  He presents today for 1 year follow-up visit.  Patient denies anginal chest pain or heart failure symptoms. Busy with day-to-day activities and also taking care of kids.  No structured exercise program or daily routine. No change in overall physical endurance. Patient states that his home blood pressures are usually around 120-130 mmHg. No family history of premature CAD in first-degree relatives.  However, on his father side uncle had a heart attack and heart transplant and another uncle died after MI but he had other confounding factors.  Paternal grandfather has had a stroke.  Review of Systems: .   Review of Systems  Cardiovascular:  Negative for chest pain, claudication, irregular heartbeat, leg swelling, near-syncope, orthopnea, palpitations, paroxysmal nocturnal dyspnea and syncope.  Respiratory:  Negative for shortness of breath.    Hematologic/Lymphatic: Negative for bleeding problem.    Studies Reviewed:   EKG: EKG Interpretation Date/Time:  Friday July 19 2023 08:12:24 EDT Ventricular Rate:  90 PR Interval:  144 QRS Duration:  88 QT Interval:  378 QTC Calculation: 462 R Axis:   64  Text Interpretation: Normal sinus rhythm T wave abnormality, consider inferior ischemia Compared to previous tracing inferior TWI are new. Confirmed by Olinda Bertrand [phone]) on 07/19/2023 8:16:49 AM  Echocardiogram: 07/12/2021:  Normal LV systolic function with visual EF 60-65%. Left ventricle cavity is normal in size. Normal left ventricular wall thickness. Normal global wall motion. Normal diastolic filling pattern, normal LAP.  Trace tricuspid regurgitation. No evidence of pulmonary hypertension.  No prior study for comparison.   Stress Testing: Exercise Sestamibi stress test 07/24/2021: Exercise time 7 minutes 10 seconds, achieved 8.86 METS, 87% APMHR.  Stress ECG negative for ischemia.   Normal myocardial perfusion without convincing evidence of reversible myocardial ischemia or prior infarct. Calculated LVEF 62%, left ventricular size and wall thickness preserved, no obvious regional wall motion abnormalities. Low risk study.    Calcium  Scoring: 06/07/2021 Left Main: 24 LAD: 312 LCx: 14 RCA: 81 Total Agatston Score: 431   MESA database percentile: 99th (for a 79 year Caucasian old male) 1. Patient's total coronary artery calcium  score is 431 which is ninety-ninth percentile for 43 year old Caucasian male (the reference MESA database does not include individuals < 38 years of age). Please note that although the presence of coronary artery calcium  documents the presence of coronary artery disease, the severity of this disease and any potential stenosis cannot be assessed on  this noncontrast CT examination. Assessment for potential risk factor modification, dietary therapy or pharmacologic therapy may be  warranted, if clinically indicated. 2. Hepatic steatosis.  RADIOLOGY: N/A  Risk Assessment/Calculations:   NA   Labs:    External Labs: Collected: April 25, 2021 provided by referring physician. Sodium 138, potassium 4.4, chloride 100, bicarb 30 BUN 12, creatinine 0.8. eGFR >60 mL/min/m AST 18, ALT 29, alkaline phosphatase 67. Hemoglobin 15.5 g/dL, hematocrit 44.0% Total cholesterol 209, triglycerides 106, HDL 32, LDL 156, non-HDL 177. TSH 0.84   External Labs: Collected: 05/22/2022 provided by PCP. Hemoglobin 14.7, hematocrit 41.5% BUN 13, creatinine 0.7. Sodium 136, potassium 4.1, chloride 103, bicarb 24 AST 19, ALT 24, alkaline phosphatase 50 Total cholesterol 110, triglycerides 59, HDL 38, LDL 60, non-HDL 72 TSH 1.06      Physical Exam:    Today's Vitals   07/19/23 0809  BP: (!) 160/94  Pulse: 80  Resp: 16  SpO2: 98%  Weight: 202 lb 3.2 oz (91.7 kg)  Height: 6' 3 (1.905 m)   Body mass index is 25.27 kg/m. Wt Readings from Last 3 Encounters:  07/19/23 202 lb 3.2 oz (91.7 kg)  06/14/22 191 lb 12.8 oz (87 kg)  04/09/22 191 lb (86.6 kg)    Physical Exam  Constitutional: No distress.  hemodynamically stable  Neck: No JVD present.  Cardiovascular: Normal rate, regular rhythm, S1 normal and S2 normal. Exam reveals no gallop, no S3 and no S4.  No murmur heard. Pulmonary/Chest: Effort normal and breath sounds normal. No stridor. He has no wheezes. He has no rales.  Musculoskeletal:        General: No edema.     Cervical back: Neck supple.  Skin: Skin is warm.     Impression & Recommendation(s):  Impression:   ICD-10-CM   1. Coronary atherosclerosis due to calcified coronary lesion  I25.10 EKG 12-Lead   I25.84 ECHOCARDIOGRAM COMPLETE    rosuvastatin  (CRESTOR ) 10 MG tablet    2. Agatston CAC score, >400  R93.1 rosuvastatin  (CRESTOR ) 10 MG tablet    3. Pure hypercholesterolemia  E78.00 rosuvastatin  (CRESTOR ) 10 MG tablet    4. Blood pressure  elevated without history of HTN  R03.0        Recommendation(s):  Coronary atherosclerosis due to calcified coronary lesion Agatston CAC score, >400 Denies anginal chest pain or heart failure symptoms. Here for yearly follow-up visit. EKG shows sinus rhythm with new T wave inversions in the inferior leads.  Today's EKG and last year's EKG were reviewed with the patient in detail during today's encounter. Clinically he is asymptomatic. No change in physical endurance. Shared decision is to proceed with an echocardiogram to reevaluate LVEF and regional wall motion abnormalities.  If relatively stable no additional workup at this time however, if echocardiogram is abnormal we will proceed with additional ischemic workup. In the meantime I have asked the patient to start walking as tolerated with a goal of 30 minutes a day 5 days a week.  Avoid high intensity sports or aerobic activity (Start slow). If he has chest pain/pressure/tightness or shortness of breath I have asked him to call me back so that we can proceed forward with CCTA. If office is closed he goes to the closest ER for further evaluation and management. If he is asymptomatic with focus on improving his modifiable cardiovascular risk factors.  Patient is agreeable with the plan of care. Continue aspirin 81 mg p.o. daily. Continue Crestor  10 mg p.o. daily. Reviewed his outside  labs from April 2025.   Pure hypercholesterolemia Refill Crestor  10 mg p.o. nightly. Does not endorse myalgias and outside labs reviewed  Blood pressure elevated without history of HTN Office blood pressures are not at goal.  They were elevated at the last office visit as well.   I am concerned that he may have underlying hypertension. However, patient states that when he does check it at home SBP's are 120-130 millimeters of mercury.  But he could do better with close monitoring. I have asked him to start checking his blood pressures more regularly and  keeping a log of his blood pressures and to review with PCP to see if medication initiation is warranted. Reemphasized importance of low-salt diet.   Orders Placed:  Orders Placed This Encounter  Procedures   EKG 12-Lead   ECHOCARDIOGRAM COMPLETE    Standing Status:   Future    Expiration Date:   07/18/2024    Where [facility] test [facility] performed[facility]    Does the patient weigh less than or greater than 250 lbs?:   Patient weighs less than 250 lbs    Perflutren DEFINITY (image enhancing agent) should be administered unless hypersensitivity or allergy exist:   Administer Perflutren    Reason for exam-Echo:   Other-Full Diagnosis List    Full ICD-10/Reason for Exam:   CAD (coronary artery disease) [102725]     Final Medication List:    Meds ordered this encounter  Medications   rosuvastatin  (CRESTOR ) 10 MG tablet    Sig: TAKE 1 TABLET(10 MG) BY MOUTH AT BEDTIME    Dispense:  90 tablet    Refill:  3    [email or web address]    Medications Discontinued During This Encounter  Medication Reason   rosuvastatin  (CRESTOR ) 10 MG tablet Reorder     Current Outpatient Medications:    aspirin EC 81 MG tablet, 1 tablet Orally Once a day, Disp: , Rfl:    cetirizine (ZYRTEC) 10 MG chewable tablet, Chew 10 mg by mouth as needed for allergies., Disp: , Rfl:    fluticasone (FLONASE) 50 MCG/ACT nasal spray, Place 1 spray into both nostrils as needed for allergies or rhinitis., Disp: , Rfl:    Multiple Vitamins-Minerals (DAILY MULTIVITAMIN) CAPS, Take 1 cap daily Oral, Disp: , Rfl:    Probiotic Product (PROBIOTIC DAILY PO), Take by mouth., Disp: , Rfl:    rosuvastatin  (CRESTOR ) 10 MG tablet, TAKE 1 TABLET(10 MG) BY MOUTH AT BEDTIME, Disp: 90 tablet, Rfl: 3  Consent:   NA  Disposition:   6 months   His questions and concerns were addressed to his satisfaction. He voices understanding of the recommendations provided during this encounter.    Signed, Awilda Bogus, [facility]  A Division of [facility] [address], [place], [place] [address]  [place], [place] [address] 07/19/2023 1:37 PM

## 2023-07-19 NOTE — Patient Instructions (Signed)
 Medication Instructions:  NO Changes *If you need a refill on your cardiac medications before your next appointment, please call your pharmacy*  Lab Work: None  Testing/Procedures: Your physician has requested that you have an echocardiogram, next available appointment. Echocardiography is a painless test that uses sound waves to create images of your heart. It provides your doctor with information about the size and shape of your heart and how well your heart's chambers and valves are working. This procedure takes approximately one hour. There are no restrictions for this procedure. Please do NOT wear cologne, perfume, aftershave, or lotions (deodorant is allowed). Please arrive 15 minutes prior to your appointment time.   Follow-Up: At [facility], you and your health needs are our priority.  As part of our continuing mission to provide you with exceptional heart care, our providers are all part of one team.  This team includes your primary Cardiologist (physician) and Advanced Practice Providers or APPs (Physician Assistants and Nurse Practitioners) who all work together to provide you with the care you need, when you need it.  Your next appointment:   6 month(s)  Provider:   Olinda Bertrand, DO   Other Instructions Please call us  or send a MyChart message with any Cardiology related questions/concerns.  [phone].  Thank you!

## 2023-08-15 IMAGING — CT CT CARDIAC CORONARY ARTERY CALCIUM SCORE
3 series · 12 of 20 positions shown, 14 images · non-contrast
Comparison: No priors.

CLINICAL DATA: 41-year-old Caucasian male with history of
hyperlipidemia and family history of coronary artery disease.
Evaluate for coronary artery disease.

EXAM:
CT CARDIAC CORONARY ARTERY CALCIUM SCORE
TECHNIQUE: Non-contrast imaging through the heart was performed using
prospective ECG gating. Image post processing was performed on an
independent workstation, allowing for quantitative analysis of the
heart and coronary arteries. Note that this exam targets the heart
and the chest was not imaged in its entirety.

[Series 2: calcium scoring 2.00 qr36 bestdiast 69% hrt calciu · axial · 0.43mm/px · z∈[+1517,+1549]mm · 2 of 80 slices shown]
[im 16/80  vessel]
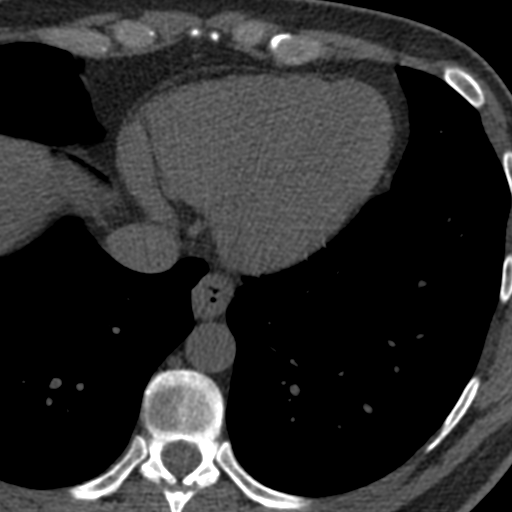
[im 32/80  vessel]
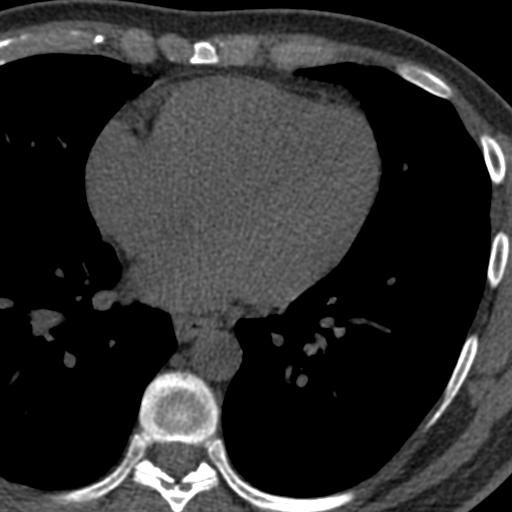

[Series 3: calcium scoring 2.00 br40 bestdiast 69% axial · axial · 0.54mm/px · z∈[+1513,+1617]mm · 5 of 80 slices shown, 7 images]
[im 14/80  vessel]
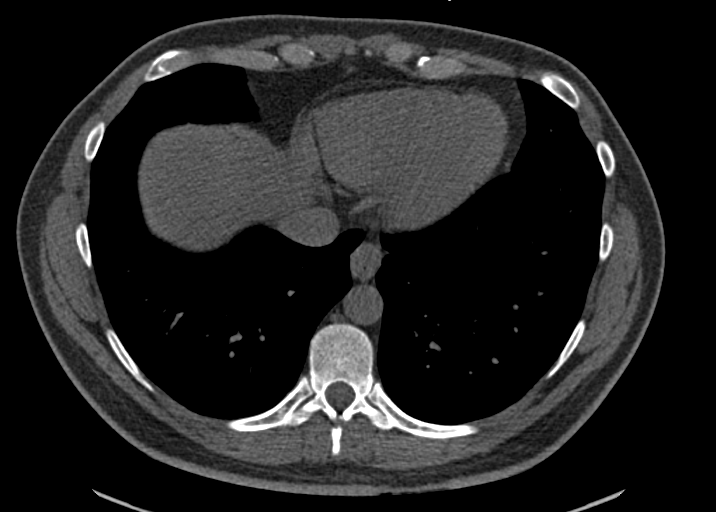
[im 14/80  lung]
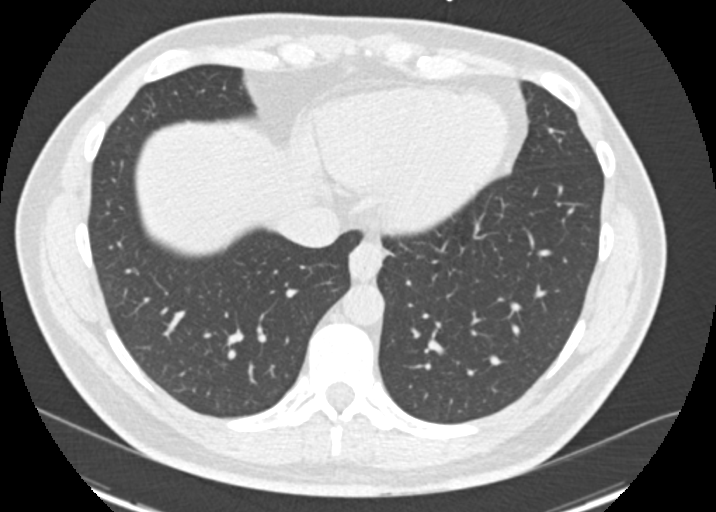
[im 27/80  vessel]
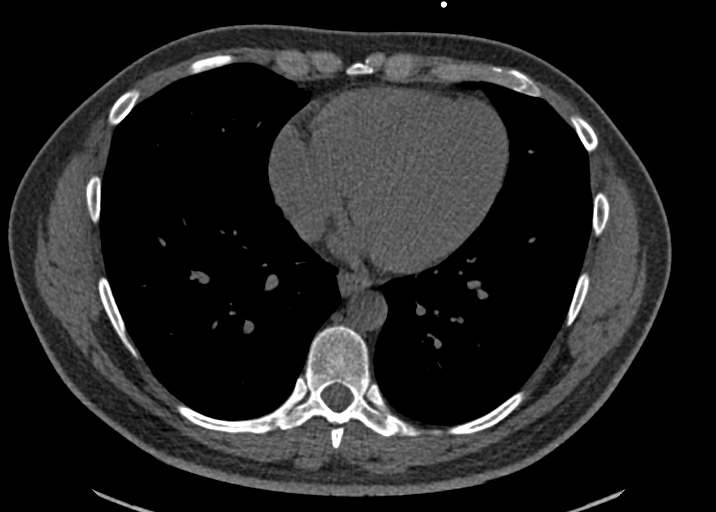
[im 40/80  vessel]
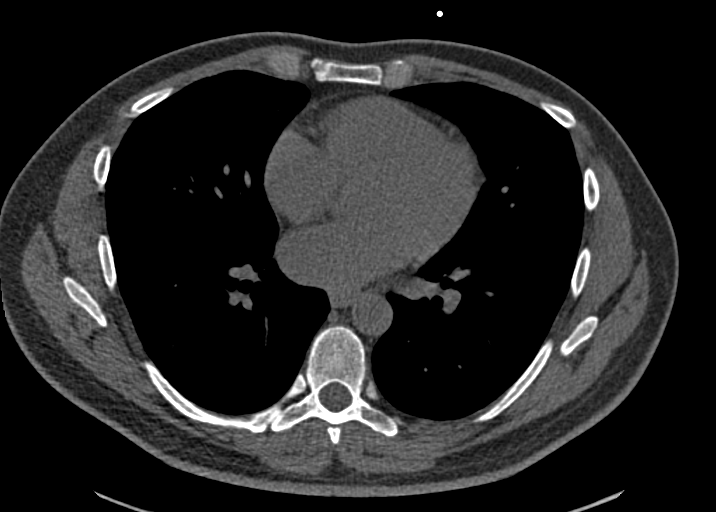
[im 53/80  vessel]
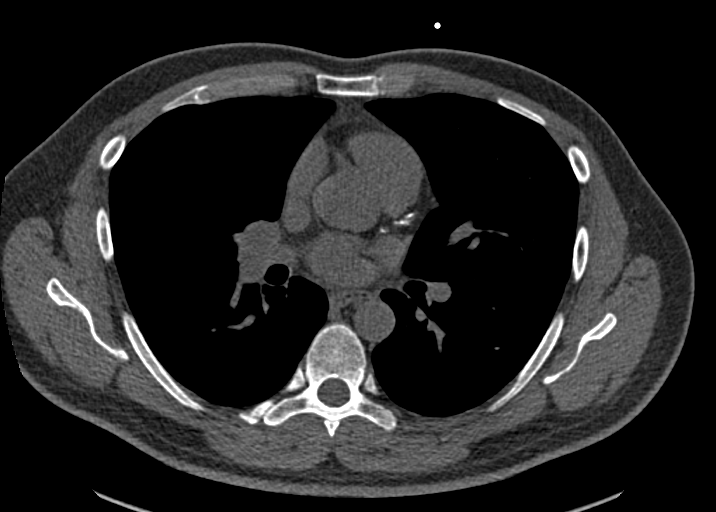
[im 66/80  vessel]
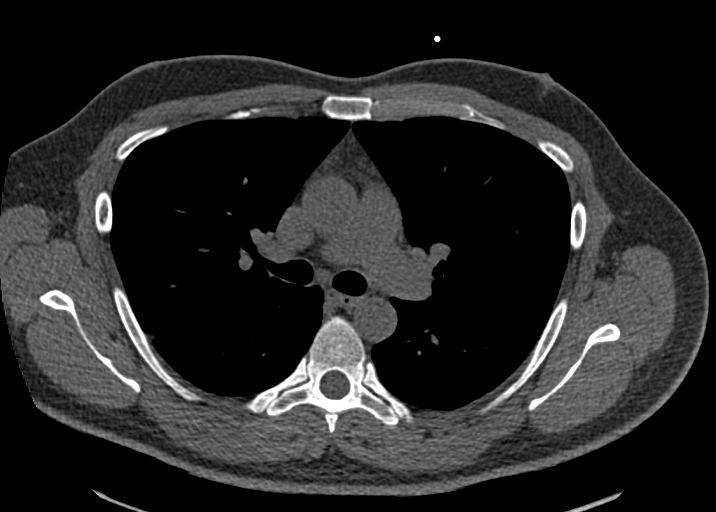
[im 66/80  lung]
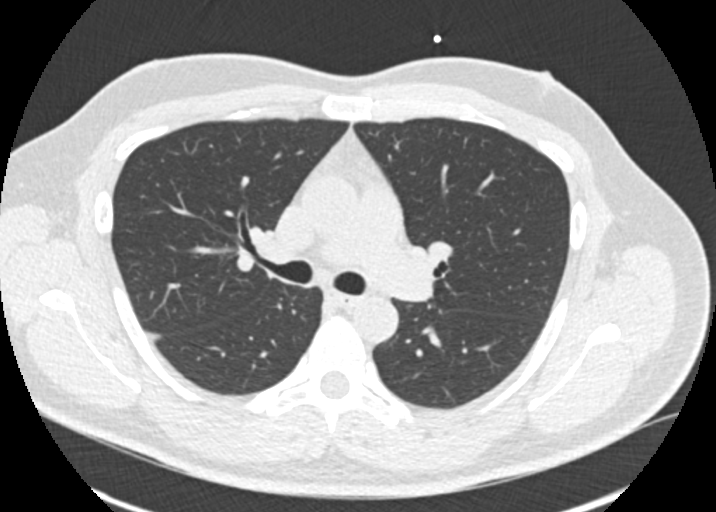

[Series 9: calcium scoring 2.00 br60 bestdiast 69% lungs · axial · 0.54mm/px · z∈[+1513,+1617]mm · 5 of 80 slices shown]
[im 14/80  vessel]
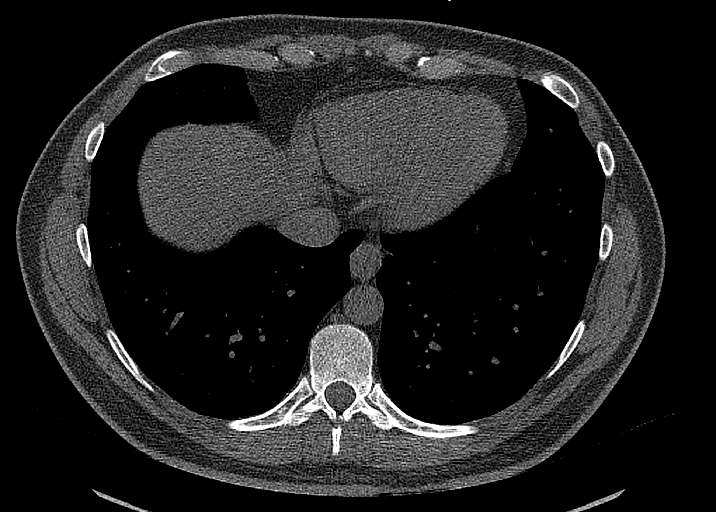
[im 27/80  vessel]
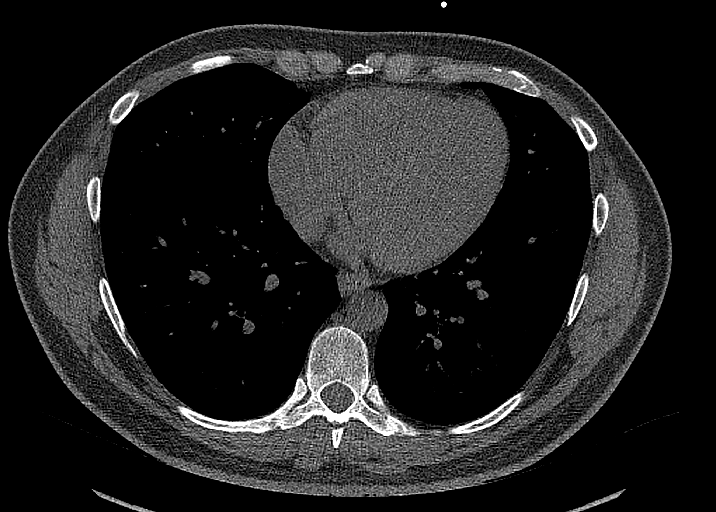
[im 40/80  vessel]
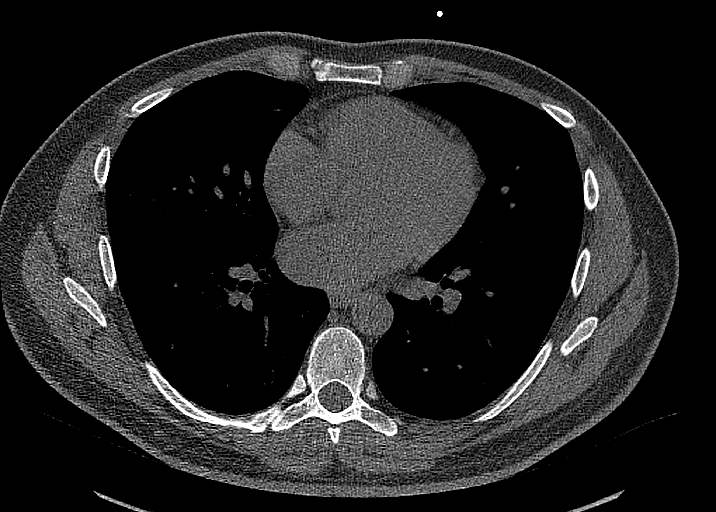
[im 53/80  vessel]
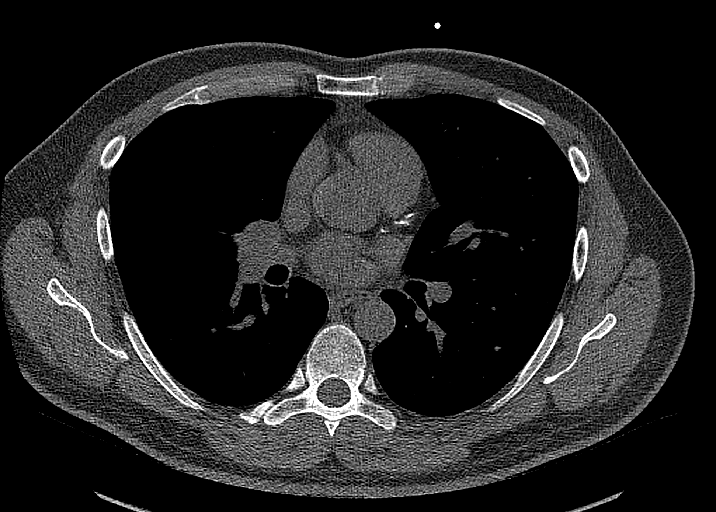
[im 66/80  vessel]
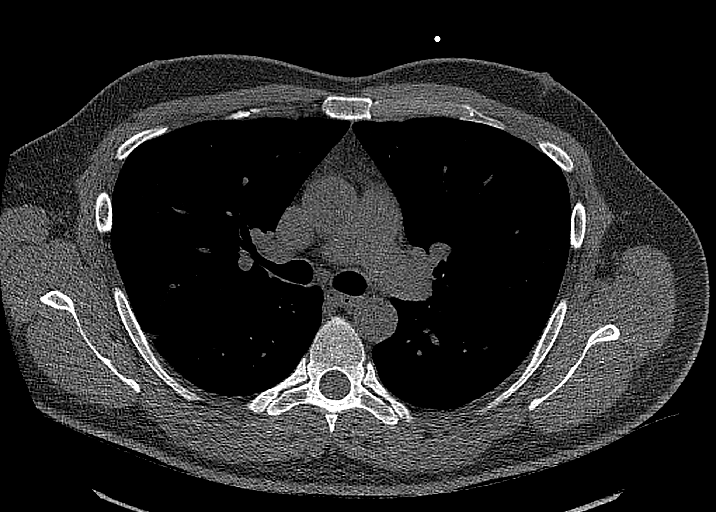

[12 of 20 positions shown; findings below may reference images not displayed]

FINDINGS: CORONARY CALCIUM SCORES:

Left Main: 24

LAD: 312

LCx: 14

RCA: 81

Total Agatston Score: 431

[HOSPITAL] percentile: 99th (for a 44 year Caucasian old male)

AORTA MEASUREMENTS:

Ascending Aorta: 30 mm

Descending Aorta: 24 mm

EXTRACARDIAC FINDINGS:

Within the visualized portions of the thorax there are no suspicious
appearing pulmonary nodules or masses, there is no acute
consolidative airspace disease, no pleural effusions, no
pneumothorax and no lymphadenopathy. Visualized portions of the
upper abdomen demonstrates diffuse low attenuation throughout the
visualized hepatic parenchyma, indicative of hepatic steatosis.
There are no aggressive appearing lytic or blastic lesions noted in
the visualized portions of the skeleton.
IMPRESSION: 1. Patient's total coronary artery calcium score is 431 which is
ninety-ninth percentile for 45-year-old Caucasian male (the
reference [HOSPITAL] does not include individuals < 45 years of
age). Please note that although the presence of coronary artery
calcium documents the presence of coronary artery disease, the
severity of this disease and any potential stenosis cannot be
assessed on this noncontrast CT examination. Assessment for
potential risk factor modification, dietary therapy or pharmacologic
therapy may be warranted, if clinically indicated.
2. Hepatic steatosis.

## 2023-09-09 ENCOUNTER — Ambulatory Visit (HOSPITAL_COMMUNITY)
Admission: RE | Admit: 2023-09-09 | Discharge: 2023-09-09 | Disposition: A | Discharge status: Home / Self Care | Source: Ambulatory Visit | Attending: Cardiology | Admitting: Cardiology

## 2023-09-09 DIAGNOSIS — I2584 Coronary atherosclerosis due to calcified coronary lesion: Secondary | ICD-10-CM | POA: Insufficient documentation

## 2023-09-09 DIAGNOSIS — I2510 Atherosclerotic heart disease of native coronary artery without angina pectoris: Secondary | ICD-10-CM | POA: Diagnosis not present

## 2023-09-09 DIAGNOSIS — G4733 Obstructive sleep apnea (adult) (pediatric): Secondary | ICD-10-CM | POA: Diagnosis not present

## 2023-09-09 LAB — ECHOCARDIOGRAM COMPLETE
Area-P 1/2: 4.68 cm2
S' Lateral: 2.2 cm

## 2023-09-17 DIAGNOSIS — H04122 Dry eye syndrome of left lacrimal gland: Secondary | ICD-10-CM | POA: Diagnosis not present

## 2023-09-24 ENCOUNTER — Ambulatory Visit: Payer: Self-pay | Admitting: Cardiology

## 2023-10-16 DIAGNOSIS — D225 Melanocytic nevi of trunk: Secondary | ICD-10-CM | POA: Diagnosis not present

## 2023-10-16 DIAGNOSIS — D2261 Melanocytic nevi of right upper limb, including shoulder: Secondary | ICD-10-CM | POA: Diagnosis not present

## 2023-10-16 DIAGNOSIS — D2262 Melanocytic nevi of left upper limb, including shoulder: Secondary | ICD-10-CM | POA: Diagnosis not present

## 2023-10-16 DIAGNOSIS — D485 Neoplasm of uncertain behavior of skin: Secondary | ICD-10-CM | POA: Diagnosis not present

## 2023-10-16 DIAGNOSIS — Z85828 Personal history of other malignant neoplasm of skin: Secondary | ICD-10-CM | POA: Diagnosis not present

## 2023-11-01 DIAGNOSIS — H43393 Other vitreous opacities, bilateral: Secondary | ICD-10-CM | POA: Diagnosis not present

## 2023-11-28 DIAGNOSIS — L988 Other specified disorders of the skin and subcutaneous tissue: Secondary | ICD-10-CM | POA: Diagnosis not present

## 2023-11-28 DIAGNOSIS — D485 Neoplasm of uncertain behavior of skin: Secondary | ICD-10-CM | POA: Diagnosis not present
